# Patient Record
Sex: Female | Born: 1965
Health system: Southern US, Community
[De-identification: ages and names within clinical notes are randomized; demographics above are authoritative.]

## PROBLEM LIST (undated history)

## (undated) DIAGNOSIS — F419 Anxiety disorder, unspecified: Secondary | ICD-10-CM

## (undated) DIAGNOSIS — J45909 Unspecified asthma, uncomplicated: Secondary | ICD-10-CM

## (undated) DIAGNOSIS — F32A Depression, unspecified: Secondary | ICD-10-CM

## (undated) DIAGNOSIS — K219 Gastro-esophageal reflux disease without esophagitis: Secondary | ICD-10-CM

## (undated) DIAGNOSIS — C50919 Malignant neoplasm of unspecified site of unspecified female breast: Secondary | ICD-10-CM

## (undated) HISTORY — PX: BREAST BIOPSY: SHX20

## (undated) HISTORY — PX: BIOPSY BREAST: PRO8

## (undated) HISTORY — PX: LYMPH NODE BIOPSY: SHX201

## (undated) HISTORY — DX: Depression, unspecified: F32.A

## (undated) HISTORY — PX: BREAST LUMPECTOMY: SHX2

## (undated) HISTORY — DX: Malignant neoplasm of unspecified site of unspecified female breast: C50.919

## (undated) HISTORY — DX: Anxiety disorder, unspecified: F41.9

## (undated) HISTORY — DX: Gastro-esophageal reflux disease without esophagitis: K21.9

## (undated) MED FILL — Docetaxel Soln for IV Infusion 160 MG/16ML: INTRAVENOUS | Qty: 16 | Status: AC

## (undated) MED FILL — Cyclophosphamide For Inj 1 GM: INTRAMUSCULAR | Qty: 61 | Status: AC

---

## 2004-09-19 HISTORY — PX: TUBAL LIGATION: SHX77

## 2014-09-19 HISTORY — PX: ABDOMINAL HYSTERECTOMY: SHX81

## 2016-08-04 ENCOUNTER — Ambulatory Visit (INDEPENDENT_AMBULATORY_CARE_PROVIDER_SITE_OTHER): Payer: Self-pay | Admitting: Orthopedic Surgery

## 2016-08-31 ENCOUNTER — Ambulatory Visit (INDEPENDENT_AMBULATORY_CARE_PROVIDER_SITE_OTHER): Payer: Self-pay | Admitting: Specialist

## 2016-09-08 ENCOUNTER — Telehealth (INDEPENDENT_AMBULATORY_CARE_PROVIDER_SITE_OTHER): Payer: Self-pay | Admitting: Specialist

## 2016-09-08 ENCOUNTER — Ambulatory Visit (INDEPENDENT_AMBULATORY_CARE_PROVIDER_SITE_OTHER): Payer: BLUE CROSS/BLUE SHIELD | Admitting: Specialist

## 2016-09-08 ENCOUNTER — Ambulatory Visit (INDEPENDENT_AMBULATORY_CARE_PROVIDER_SITE_OTHER): Payer: Self-pay

## 2016-09-08 ENCOUNTER — Encounter (INDEPENDENT_AMBULATORY_CARE_PROVIDER_SITE_OTHER): Payer: Self-pay | Admitting: Specialist

## 2016-09-08 ENCOUNTER — Encounter (INDEPENDENT_AMBULATORY_CARE_PROVIDER_SITE_OTHER): Payer: Self-pay

## 2016-09-08 VITALS — BP 146/73 | HR 53 | Ht 61.0 in | Wt 187.0 lb

## 2016-09-08 DIAGNOSIS — M5442 Lumbago with sciatica, left side: Secondary | ICD-10-CM | POA: Diagnosis not present

## 2016-09-08 DIAGNOSIS — M5416 Radiculopathy, lumbar region: Secondary | ICD-10-CM | POA: Diagnosis not present

## 2016-09-08 DIAGNOSIS — M5441 Lumbago with sciatica, right side: Secondary | ICD-10-CM | POA: Diagnosis not present

## 2016-09-08 DIAGNOSIS — M7062 Trochanteric bursitis, left hip: Secondary | ICD-10-CM | POA: Diagnosis not present

## 2016-09-08 DIAGNOSIS — G5762 Lesion of plantar nerve, left lower limb: Secondary | ICD-10-CM

## 2016-09-08 MED ORDER — METHYLPREDNISOLONE 4 MG PO TABS: ORAL_TABLET | ORAL | 0 refills | Status: DC

## 2016-09-08 NOTE — Telephone Encounter (Signed)
Pt requested an MRI rev appt on 1/5 at 4p for nitka can you please open something up?  Thank you  Pt number is [phone] can leave vm

## 2016-09-08 NOTE — Progress Notes (Signed)
Office Visit Note   Patient: Kendra Collins           Date of Birth: 04-Jun-1966           MRN: [id number] Visit Date: 09/08/2016              Requested by: No referring provider defined for this encounter. PCP: Pcp Not In System   Assessment & Plan: Visit Diagnoses:  1. Acute midline low back pain with bilateral sciatica   2. Radiculopathy, lumbar region   3. Morton's neuroma, left   4. Greater trochanteric bursitis, left     Plan:  With patient's ongoing and worsening lumbar spine symptoms we will schedule MRI to rule out HNP/stenosis. Follow-up the office after completion to discuss results and further treatment options. I did give her a prescription for Medrol Dosepak 6 day taper see if this will help with all areas addressed today. The future we did discuss conservative management with left hip trochanter bursa injections if needed. We could also consider injections for Morton's neuroma. Patient advised to avoid wearing narrow shoes. May consider custom orthotics. All questions answered.  Follow-Up Instructions: No Follow-up on file.   Orders:  Orders Placed This Encounter  Procedures  . XR Lumbar Spine 2-3 Views  . MR Lumbar Spine w/o contrast   Meds ordered this encounter  Medications  . methylPREDNISolone (MEDROL) 4 MG tablet    Sig: 6 day taper take as directed    Dispense:  21 tablet    Refill:  0      Procedures: No procedures performed   Clinical Data: No additional findings.   Subjective: Chief Complaint  Patient presents with  . Lower Back - Pain    Patient comes in with low back pain radiating into posterior aspect of left hip. States numbness and tingling in bilateral legs. Pain for years, noticed it when she was pregnant. Getting worse the past two years. Constant pain. Standing, standing walking for long periods of time causes increase pain. Loses balance at times.   Describes having pain mostly in the left buttock left lateral hip down to her  left knee. Doesn't having some aching and numbness and tingling in both lower extremities when she is ambulating or standing for long periods. Describes having (the right foot pain between the first and second and second and third metatarsal heads that causes pain and numbness in her toes. Foot symptoms are worse when she wears tight shoes. No complaints of groin pain. She did see an orthopedic surgeon in [place] but she states that she was not pleased with that visit.  Review of Systems  Constitutional: Negative.   HENT: Negative.   Eyes: Negative.   Respiratory: Negative.   Cardiovascular: Negative.   Gastrointestinal: Negative.   Genitourinary: Negative.   Musculoskeletal: Positive for back pain.  Skin: Negative.   Hematological: Negative.   Psychiatric/Behavioral: Negative.      Objective: Vital Signs: BP (!) 146/73   Pulse (!) 53   Ht 5\' 1"  (1.549 m)   Wt 187 lb (84.8 kg)   BMI 35.33 kg/m   Physical Exam  Constitutional: She is oriented to person, place, and time. She appears well-developed. No distress.  HENT:  Head: Normocephalic and atraumatic.  Eyes: EOM are normal. Pupils are equal, round, and reactive to light.  Neck: Normal range of motion.  Pulmonary/Chest: Effort normal. No respiratory distress.  Abdominal: She exhibits no distension.  Musculoskeletal: Normal range of motion.  Gait is  normal. She has good lumbar flexion and extension without discomfort. She has mild left-sided lumbar paraspinal tenderness. Mild left sciatic notch tenderness. Negative on the right side. She is moderate to markedly tender over the left hip greater trochanter bursa and down the course of the IT band. Nontender on the right. Negative logroll. Negative straight leg raise. Bilateral knee exam unremarkable. Bilateral calves are nontender. Neurovascularly intact. No focal motor deficits. Bilateral ankles unremarkable. Bilateral foot exam shows left greater than right tenderness between the  first and second and second and third metatarsal heads.  Neurological: She is alert and oriented to person, place, and time.  Skin: Skin is warm and dry.    Ortho Exam  Specialty Comments:  No specialty comments available.  Imaging: No results found.   PMFS History: There are no active problems to display for this patient.  No past medical history on file.  No family history on file.  No past surgical history on file. Social History   Occupational History  . Not on file.   Social History Main Topics  . Smoking status: Never Smoker  . Smokeless tobacco: Never Used  . Alcohol use No  . Drug use: Unknown  . Sexual activity: Not on file

## 2016-09-08 NOTE — Telephone Encounter (Signed)
sw patient appt 09/29/16 @ 2:45pm mri review

## 2016-09-09 ENCOUNTER — Encounter: Payer: Self-pay | Admitting: Surgery

## 2016-09-29 ENCOUNTER — Encounter (INDEPENDENT_AMBULATORY_CARE_PROVIDER_SITE_OTHER): Payer: Self-pay | Admitting: Specialist

## 2016-09-29 ENCOUNTER — Ambulatory Visit (INDEPENDENT_AMBULATORY_CARE_PROVIDER_SITE_OTHER): Payer: BLUE CROSS/BLUE SHIELD | Admitting: Specialist

## 2016-09-29 VITALS — BP 112/77 | HR 74 | Ht 61.0 in | Wt 187.0 lb

## 2016-09-29 DIAGNOSIS — G8929 Other chronic pain: Secondary | ICD-10-CM

## 2016-09-29 DIAGNOSIS — M5442 Lumbago with sciatica, left side: Secondary | ICD-10-CM | POA: Diagnosis not present

## 2016-09-29 DIAGNOSIS — M48062 Spinal stenosis, lumbar region with neurogenic claudication: Secondary | ICD-10-CM | POA: Diagnosis not present

## 2016-09-29 DIAGNOSIS — M5136 Other intervertebral disc degeneration, lumbar region: Secondary | ICD-10-CM | POA: Diagnosis not present

## 2016-09-29 MED ORDER — TRAMADOL-ACETAMINOPHEN 37.5-325 MG PO TABS: 1.0000 | ORAL_TABLET | Freq: Four times a day (QID) | ORAL | 0 refills | Status: DC | PRN

## 2016-09-29 MED ORDER — NAPROXEN-ESOMEPRAZOLE 375-20 MG PO TBEC: DELAYED_RELEASE_TABLET | ORAL | 3 refills | Status: DC

## 2016-09-29 NOTE — Progress Notes (Signed)
Office Visit Note   Patient: Kendra Collins           Date of Birth: 08-Jul-1966           MRN: [id number] Visit Date: 09/29/2016              Requested by: No referring provider defined for this encounter. PCP: Pcp Not In System   Assessment & Plan: Visit Diagnoses:  1. Chronic bilateral low back pain with left-sided sciatica   2. Degenerative disc disease, lumbar   3. Spinal stenosis of lumbar region with neurogenic claudication     Plan: Avoid frequent bending and stooping  No lifting greater than 10 lbs. May use ice or moist heat for pain. Weight loss is of benefit.   Follow-Up Instructions: Return in about 6 weeks (around 11/10/2016).   Orders:  No orders of the defined types were placed in this encounter.  No orders of the defined types were placed in this encounter.     Procedures: No procedures performed   Clinical Data: Findings:  MRI shows Degenerative disc changes, L3-4, L4-5 and L5-S1 with modic changes at L5-S1. There is assymetri disc bulging with mild left lateral recess stenosis L3-4 and L4-5.    Subjective: Chief Complaint  Patient presents with  . Lower Back - Follow-up    MRI Review    Kendra Collins is here to follow up after her MRI of the lumbar spine. She states that her syptoms are the same. Pain with standing in one place and sitting for any length of time. With standing moving legs helps. Pain is in the back with radiation into the buttocks and into the anterolateral thighs. There is numbness and paresthiasis into the lateral thighs left greater than right.  MRI is reviewed with her today.    Review of Systems  Constitutional: Negative.   HENT: Positive for rhinorrhea, sinus pain and sinus pressure.   Eyes: Negative.   Respiratory: Positive for cough and wheezing.   Cardiovascular: Negative.   Gastrointestinal: Negative.   Endocrine: Negative.   Genitourinary: Negative.   Musculoskeletal: Negative.   Skin: Negative.     Allergic/Immunologic: Negative.   Neurological: Negative.   Hematological: Negative.   Psychiatric/Behavioral: Negative.      Objective: Vital Signs: BP 112/77 (BP Location: Left Arm, Patient Position: Sitting)   Pulse 74   Ht 5\' 1"  (1.549 m)   Wt 187 lb (84.8 kg)   BMI 35.33 kg/m   Physical Exam  Constitutional: She is oriented to person, place, and time. She appears well-developed and well-nourished.  HENT:  Head: Normocephalic and atraumatic.  Eyes: EOM are normal. Pupils are equal, round, and reactive to light.  Neck: Normal range of motion. Neck supple.  Pulmonary/Chest: Effort normal and breath sounds normal.  Abdominal: Soft. Bowel sounds are normal.  Neurological: She is alert and oriented to person, place, and time.  Skin: Skin is warm and dry.  Psychiatric: She has a normal mood and affect. Her behavior is normal. Judgment and thought content normal.    Back Exam   Tenderness  The patient is experiencing tenderness in the lumbar.  Range of Motion  Extension: normal  Flexion: abnormal  Lateral Bend Right: abnormal  Lateral Bend Left: abnormal  Rotation Left: abnormal   Muscle Strength  Right Quadriceps:  5/5  Left Quadriceps:  5/5  Right Hamstrings:  5/5  Left Hamstrings:  5/5   Tests  Straight leg raise right: negative Straight leg raise  left: negative  Reflexes  Patellar: Hyporeflexic Achilles: Hyporeflexic Babinski's sign: normal   Other  Toe Walk: normal Heel Walk: normal Sensation: normal Gait: normal  Erythema: no back redness Scars: absent      Specialty Comments:  No specialty comments available.  Imaging: No results found.   PMFS History: There are no active problems to display for this patient.  No past medical history on file.  No family history on file.  No past surgical history on file. Social History   Occupational History  . Not on file.   Social History Main Topics  . Smoking status: Never Smoker  .  Smokeless tobacco: Never Used  . Alcohol use No  . Drug use: Unknown  . Sexual activity: Not on file

## 2016-09-29 NOTE — Patient Instructions (Signed)
Avoid frequent bending and stooping  No lifting greater than 10 lbs. May use ice or moist heat for pain. Weight loss is of benefit.   

## 2016-09-30 ENCOUNTER — Telehealth (INDEPENDENT_AMBULATORY_CARE_PROVIDER_SITE_OTHER): Payer: Self-pay | Admitting: Specialist

## 2016-09-30 NOTE — Telephone Encounter (Signed)
Can you advise? 

## 2016-09-30 NOTE — Telephone Encounter (Signed)
Can you ask Dr. Yates about this? 

## 2016-09-30 NOTE — Telephone Encounter (Signed)
[facility]

## 2016-09-30 NOTE — Telephone Encounter (Signed)
Ok with me ,ucall

## 2016-11-10 ENCOUNTER — Ambulatory Visit (INDEPENDENT_AMBULATORY_CARE_PROVIDER_SITE_OTHER): Payer: BLUE CROSS/BLUE SHIELD | Admitting: Specialist

## 2016-12-15 ENCOUNTER — Ambulatory Visit (INDEPENDENT_AMBULATORY_CARE_PROVIDER_SITE_OTHER): Payer: BLUE CROSS/BLUE SHIELD | Admitting: Surgery

## 2016-12-15 ENCOUNTER — Encounter (INDEPENDENT_AMBULATORY_CARE_PROVIDER_SITE_OTHER): Payer: Self-pay | Admitting: Physician Assistant

## 2016-12-15 DIAGNOSIS — M5442 Lumbago with sciatica, left side: Secondary | ICD-10-CM

## 2016-12-15 DIAGNOSIS — M5136 Other intervertebral disc degeneration, lumbar region: Secondary | ICD-10-CM | POA: Diagnosis not present

## 2016-12-15 DIAGNOSIS — M48062 Spinal stenosis, lumbar region with neurogenic claudication: Secondary | ICD-10-CM

## 2016-12-15 DIAGNOSIS — G8929 Other chronic pain: Secondary | ICD-10-CM | POA: Diagnosis not present

## 2016-12-15 MED ORDER — TRAMADOL-ACETAMINOPHEN 37.5-325 MG PO TABS: 1.0000 | ORAL_TABLET | Freq: Two times a day (BID) | ORAL | 0 refills | Status: DC | PRN

## 2016-12-15 NOTE — Progress Notes (Signed)
Office Visit Note   Patient: Kendra Collins           Date of Birth: Jan 15, 1966           MRN: [id number] Visit Date: 12/15/2016              Requested by: No referring provider defined for this encounter. PCP: Pcp Not In System   Assessment & Plan: Visit Diagnoses:  1. Chronic bilateral low back pain with left-sided sciatica   2. Degenerative disc disease, lumbar   3. Spinal stenosis of lumbar region with neurogenic claudication     Plan: Advised patient that we do not really fill out this particular FMLA form that she brought in. If she has a flare up of her symptoms recommend that she contact us immediately and schedule a follow-up appointment. She voices understanding. Refilled Ultracet. Given samples of Pennsaid to apply over the left SI joint area. If this helps she will call and let us know and we'll send in a prescription.  Follow-Up Instructions: Return in 6 months (on 06/17/2017).   Orders:  No orders of the defined types were placed in this encounter.  Meds ordered this encounter  Medications  . traMADol-acetaminophen (ULTRACET) 37.5-325 MG tablet    Sig: Take 1 tablet by mouth every 12 (twelve) hours as needed for severe pain.    Dispense:  90 tablet    Refill:  0      Procedures: No procedures performed   Clinical Data: No additional findings.   Subjective: Chief Complaint  Patient presents with  . Left Hip - Follow-up    HPI Patient returns for recheck. Currently doing much better. She still has some soreness around the left lumbar area and also over the left lateral hip.  She is currently working. No completes of true lower extremity radicular symptoms. I had ordered lumbar spine MRI report from 09/09/2016 read L3-4 mild disc space narrowing. Minimal disc bulging and small left foraminal disc protrusion with punctate annular fissure. No significant stenosis through the disc protrusion is in close proximity to the left L4 nerve roots in the lateral  recess. L4-5 mild disc bulging and mild facet hypertrophy resulting in mild left lateral recess stenosis and potential left L5 nerve root irritation although no frank compression. Patient brought in a FMLA form and asked about this getting filled out in case she has a flare up of symptoms and needs to be out of work. Patient also asked for a refill of Ultracet. States that she is unable to take oral NSAIDs due to history of GERD. Review of Systems  Constitutional: Negative.   HENT: Negative.   Respiratory: Negative.   Musculoskeletal: Positive for back pain. Negative for gait problem.  Psychiatric/Behavioral: Negative.      Objective: Vital Signs: There were no vitals taken for this visit.  Physical Exam  Constitutional: She is oriented to person, place, and time. She appears well-developed and well-nourished.  HENT:  Head: Normocephalic and atraumatic.  Eyes: EOM are normal. Pupils are equal, round, and reactive to light.  Neck: Normal range of motion.  Pulmonary/Chest: No respiratory distress.  Abdominal: She exhibits no distension.  Musculoskeletal:  Mild left lower lumbar paraspinal tenderness. Mild tenderness over the left SI joint. Negative straight leg raise. Negative logroll. Mild tenderness over the left hip greater bursa.  Neurological: She is alert and oriented to person, place, and time.  Skin: Skin is warm and dry.  Psychiatric: She has a normal mood  and affect.    Ortho Exam  Specialty Comments:  No specialty comments available.  Imaging: No results found.   PMFS History: There are no active problems to display for this patient.  No past medical history on file.  No family history on file.  No past surgical history on file. Social History   Occupational History  . Not on file.   Social History Main Topics  . Smoking status: Never Smoker  . Smokeless tobacco: Never Used  . Alcohol use No  . Drug use: Unknown  . Sexual activity: Not on file

## 2020-01-09 ENCOUNTER — Inpatient Hospital Stay (HOSPITAL_COMMUNITY)
Admission: AD | Admit: 2020-01-09 | Discharge: 2020-01-15 | DRG: 177 | Disposition: A | Discharge status: Home Health | Payer: No Typology Code available for payment source | Source: Other Acute Inpatient Hospital | Attending: Internal Medicine | Admitting: Internal Medicine

## 2020-01-09 ENCOUNTER — Other Ambulatory Visit: Payer: Self-pay

## 2020-01-09 ENCOUNTER — Encounter (HOSPITAL_COMMUNITY): Payer: Self-pay | Admitting: Family Medicine

## 2020-01-09 ENCOUNTER — Telehealth: Payer: Self-pay | Admitting: Internal Medicine

## 2020-01-09 DIAGNOSIS — J9621 Acute and chronic respiratory failure with hypoxia: Secondary | ICD-10-CM

## 2020-01-09 DIAGNOSIS — J159 Unspecified bacterial pneumonia: Secondary | ICD-10-CM | POA: Diagnosis present

## 2020-01-09 DIAGNOSIS — J4520 Mild intermittent asthma, uncomplicated: Secondary | ICD-10-CM | POA: Diagnosis present

## 2020-01-09 DIAGNOSIS — Z87891 Personal history of nicotine dependence: Secondary | ICD-10-CM | POA: Diagnosis not present

## 2020-01-09 DIAGNOSIS — Z79899 Other long term (current) drug therapy: Secondary | ICD-10-CM | POA: Diagnosis not present

## 2020-01-09 DIAGNOSIS — Z6836 Body mass index (BMI) 36.0-36.9, adult: Secondary | ICD-10-CM | POA: Diagnosis not present

## 2020-01-09 DIAGNOSIS — F419 Anxiety disorder, unspecified: Secondary | ICD-10-CM | POA: Diagnosis present

## 2020-01-09 DIAGNOSIS — J1282 Pneumonia due to coronavirus disease 2019: Secondary | ICD-10-CM | POA: Diagnosis present

## 2020-01-09 DIAGNOSIS — R609 Edema, unspecified: Secondary | ICD-10-CM | POA: Diagnosis not present

## 2020-01-09 DIAGNOSIS — R0602 Shortness of breath: Secondary | ICD-10-CM

## 2020-01-09 DIAGNOSIS — J069 Acute upper respiratory infection, unspecified: Secondary | ICD-10-CM

## 2020-01-09 DIAGNOSIS — U071 COVID-19: Principal | ICD-10-CM

## 2020-01-09 DIAGNOSIS — R7401 Elevation of levels of liver transaminase levels: Secondary | ICD-10-CM | POA: Diagnosis not present

## 2020-01-09 HISTORY — DX: Unspecified asthma, uncomplicated: J45.909

## 2020-01-09 MED ORDER — ALBUTEROL SULFATE HFA 108 (90 BASE) MCG/ACT IN AERS
2.0000 | INHALATION_SPRAY | RESPIRATORY_TRACT | Status: DC | PRN
  Administered 2020-01-09: 2 via RESPIRATORY_TRACT
  Filled 2020-01-09: qty 6.7

## 2020-01-09 MED ORDER — SODIUM CHLORIDE 0.9 % IV SOLN
250.0000 mL | INTRAVENOUS | Status: DC | PRN
  Administered 2020-01-09: 250 mL via INTRAVENOUS

## 2020-01-09 MED ORDER — SODIUM CHLORIDE 0.9 % IV SOLN
2.0000 g | INTRAVENOUS | Status: AC
  Administered 2020-01-09 – 2020-01-13 (×5): 2 g via INTRAVENOUS
  Filled 2020-01-09 (×5): qty 20

## 2020-01-09 MED ORDER — ONDANSETRON HCL 4 MG/2ML IJ SOLN: 4.0000 mg | Freq: Four times a day (QID) | INTRAMUSCULAR | Status: DC | PRN

## 2020-01-09 MED ORDER — SODIUM CHLORIDE 0.9 % IV SOLN
500.0000 mg | INTRAVENOUS | Status: DC
  Administered 2020-01-09 – 2020-01-12 (×4): 500 mg via INTRAVENOUS
  Filled 2020-01-09 (×5): qty 500

## 2020-01-09 MED ORDER — HYDROCODONE-ACETAMINOPHEN 5-325 MG PO TABS: 1.0000 | ORAL_TABLET | ORAL | Status: DC | PRN

## 2020-01-09 MED ORDER — SENNOSIDES-DOCUSATE SODIUM 8.6-50 MG PO TABS: 1.0000 | ORAL_TABLET | Freq: Every evening | ORAL | Status: DC | PRN

## 2020-01-09 MED ORDER — SODIUM CHLORIDE 0.9% FLUSH: 3.0000 mL | INTRAVENOUS | Status: DC | PRN

## 2020-01-09 MED ORDER — SODIUM CHLORIDE 0.9% FLUSH
3.0000 mL | Freq: Two times a day (BID) | INTRAVENOUS | Status: DC
  Administered 2020-01-09 – 2020-01-15 (×12): 3 mL via INTRAVENOUS

## 2020-01-09 MED ORDER — ALUM & MAG HYDROXIDE-SIMETH 200-200-20 MG/5ML PO SUSP
30.0000 mL | ORAL | Status: DC | PRN
  Administered 2020-01-09 – 2020-01-13 (×8): 30 mL via ORAL
  Filled 2020-01-09 (×9): qty 30

## 2020-01-09 MED ORDER — SODIUM CHLORIDE 0.9% FLUSH
3.0000 mL | Freq: Two times a day (BID) | INTRAVENOUS | Status: DC
  Administered 2020-01-10 (×2): 3 mL via INTRAVENOUS

## 2020-01-09 MED ORDER — ACETAMINOPHEN 325 MG PO TABS
650.0000 mg | ORAL_TABLET | Freq: Four times a day (QID) | ORAL | Status: DC | PRN
  Administered 2020-01-10: 650 mg via ORAL
  Filled 2020-01-09: qty 2

## 2020-01-09 MED ORDER — ENOXAPARIN SODIUM 40 MG/0.4ML ~~LOC~~ SOLN
40.0000 mg | SUBCUTANEOUS | Status: DC
  Administered 2020-01-09 – 2020-01-11 (×3): 40 mg via SUBCUTANEOUS
  Filled 2020-01-09 (×3): qty 0.4

## 2020-01-09 MED ORDER — DEXAMETHASONE SODIUM PHOSPHATE 10 MG/ML IJ SOLN: 6.0000 mg | INTRAMUSCULAR | Status: DC

## 2020-01-09 MED ORDER — SODIUM CHLORIDE 0.9 % IV SOLN
100.0000 mg | Freq: Every day | INTRAVENOUS | Status: AC
  Administered 2020-01-10 – 2020-01-13 (×4): 100 mg via INTRAVENOUS
  Filled 2020-01-09 (×4): qty 20

## 2020-01-09 MED ORDER — GUAIFENESIN-DM 100-10 MG/5ML PO SYRP
10.0000 mL | ORAL_SOLUTION | ORAL | Status: DC | PRN
  Administered 2020-01-13 – 2020-01-14 (×2): 10 mL via ORAL
  Filled 2020-01-09 (×2): qty 10

## 2020-01-09 MED ORDER — ONDANSETRON HCL 4 MG PO TABS: 4.0000 mg | ORAL_TABLET | Freq: Four times a day (QID) | ORAL | Status: DC | PRN

## 2020-01-09 NOTE — Progress Notes (Signed)
[facility] TELEPHONE ENCOUNTER NOTE  Patient: Kendra Collins YBT:[id number]   PCP: System, Pcp Not In DOB: November 04, 1965   DOS: 01/09/2020     Received a phone call from [facility] about patient transfer. Patient tested Covid positive in early April. Progressively getting worse with fever and started having shortness of breath. On arrival patient was 86% on room air requiring 2 L of oxygen. Now progressively worsening to 6 L of oxygen saturating 92%.  D-dimer is 840, ferritin is in 2000, LFTs are in the 90s and CRP is 270. Patient is receiving IV Decadron. Patient will be accepted to progressive care unit at [facility].  Author:  Lynden Oxford, MD [facility] 01/09/2020  If 7PM-7AM, please contact night-coverage To reach On-call, see [email or web address]

## 2020-01-09 NOTE — H&P (Signed)
History and Physical    EVAGELIA KNACK NLG:[id number] DOB: 1965-09-21 DOA: 01/09/2020  PCP: System, Pcp Not In   Patient coming from: Home   Chief Complaint: SOB   HPI: Kendra Collins is a 54 y.o. female with medical history significant for mild intermittent asthma, now presenting to emergency department with progressive shortness of breath.  Patient reports that she developed general malaise, fevers, chills, nausea, vomiting, diarrhea, and cough almost 2 weeks ago, was seen in the urgent care at that time and found to be positive for COVID-19.  She continues to have general malaise, fatigue, and improving GI symptoms, but is mainly concerned with progressive dyspnea, now short of breath while at rest.  She denies any chest pain or abdominal pain, denies lower extremity swelling or tenderness, and denies melena or hematochezia.  [facility] ED Course: Upon arrival to the ED, patient is found to be afebrile, saturating mid 80s on room air, and with remaining vitals normal.  EKG with sinus rhythm with sinus arrhythmia.  Chest x-ray features bilateral hazy airspace opacities.  Chemistry panel with mild elevation in transaminases.  CBC unremarkable.  Troponin undetectable.  Procalcitonin elevated to 51.7, D-dimer elevated to 0.84, and CRP is >220.  Patient was started on supplemental oxygen and treated with Decadron, remdesivir, vancomycin, and cefepime in the ED.  Review of Systems:  All other systems reviewed and apart from HPI, are negative.  Past Medical History:  Diagnosis Date  . Asthma     History reviewed. No pertinent surgical history.   reports that she has quit smoking. She has never used smokeless tobacco. She reports that she does not drink alcohol or use drugs.  No Known Allergies  Family History  Problem Relation Age of Onset  . CVA Father   . Heart disease Father      Prior to Admission medications   Medication Sig Start Date End Date Taking? Authorizing Provider  BELVIQ  XR 20 MG TB24 Take 1 tablet by mouth daily. 08/01/16   [provider]  estradiol (ESTRACE) 0.5 MG tablet Take 0.5 mg by mouth daily. 08/15/16   [provider]  fluticasone Asencion Islam) 50 MCG/ACT nasal spray TWO SPRAYS IN [facility] NOSTRIL DAILY 08/31/16   [provider]  methylPREDNISolone (MEDROL) 4 MG tablet 6 day taper take as directed 09/08/16   Lanae Crumbly, PA-C  montelukast (SINGULAIR) 10 MG tablet Take 10 mg by mouth every evening. 08/24/16   [provider]  Naproxen-Esomeprazole (VIMOVO) 375-20 MG TBEC Take one tablet po BID 09/29/16   Jessy Oto, MD  oseltamivir (TAMIFLU) 75 MG capsule Take 75 mg by mouth 2 (two) times daily. 08/24/16   [provider]  traMADol-acetaminophen (ULTRACET) 37.5-325 MG tablet Take 1 tablet by mouth every 12 (twelve) hours as needed for severe pain. 12/15/16   Lanae Crumbly, PA-C    Physical Exam: Vitals:   01/09/20 2011  BP: 115/70  Pulse: 71  Resp: 18  Temp: 98.1 F (36.7 C)  TempSrc: Oral  SpO2: 93%     Constitutional: NAD, calm  Eyes: PERTLA, lids and conjunctivae normal ENMT: Mucous membranes are moist. Posterior pharynx clear of any exudate or lesions.   Neck: normal, supple, no masses, no thyromegaly Respiratory: tachypnea, no wheezing. No pallor or cyanosis use.   Cardiovascular: S1 & S2 heard, regular rate and rhythm. No extremity edema.   Abdomen: No distension, no tenderness, soft. Bowel sounds active.  Musculoskeletal: no clubbing / cyanosis. No joint  deformity upper and lower extremities.   Skin: no significant rashes, lesions, ulcers. Warm, dry, well-perfused. Neurologic: No facial asymmetry. Sensation intact. Moving all extremities.  Psychiatric: Alert and oriented to person, place, and situation. Very pleasant and cooperative.    Labs and Imaging on Admission: I have personally reviewed following labs and imaging studies  CBC: No results for input(s): WBC, NEUTROABS, HGB, HCT,  MCV, PLT in the last 168 hours. Basic Metabolic Panel: No results for input(s): NA, K, CL, CO2, GLUCOSE, BUN, CREATININE, CALCIUM, MG, PHOS in the last 168 hours. GFR: CrCl cannot be calculated (No successful lab value found.). Liver Function Tests: No results for input(s): AST, ALT, ALKPHOS, BILITOT, PROT, ALBUMIN in the last 168 hours. No results for input(s): LIPASE, AMYLASE in the last 168 hours. No results for input(s): AMMONIA in the last 168 hours. Coagulation Profile: No results for input(s): INR, PROTIME in the last 168 hours. Cardiac Enzymes: No results for input(s): CKTOTAL, CKMB, CKMBINDEX, TROPONINI in the last 168 hours. BNP (last 3 results) No results for input(s): PROBNP in the last 8760 hours. HbA1C: No results for input(s): HGBA1C in the last 72 hours. CBG: No results for input(s): GLUCAP in the last 168 hours. Lipid Profile: No results for input(s): CHOL, HDL, LDLCALC, TRIG, CHOLHDL, LDLDIRECT in the last 72 hours. Thyroid Function Tests: No results for input(s): TSH, T4TOTAL, FREET4, T3FREE, THYROIDAB in the last 72 hours. Anemia Panel: No results for input(s): VITAMINB12, FOLATE, FERRITIN, TIBC, IRON, RETICCTPCT in the last 72 hours. Urine analysis: No results found for: COLORURINE, APPEARANCEUR, LABSPEC, PHURINE, GLUCOSEU, HGBUR, BILIRUBINUR, KETONESUR, PROTEINUR, UROBILINOGEN, NITRITE, LEUKOCYTESUR Sepsis Labs: @LABRCNTIP (procalcitonin:4,lacticidven:4) )No results found for this or any previous visit (from the past 240 hour(s)).   Radiological Exams on Admission: No results found.  EKG: Independently reviewed. Sinus rhythm with sinus arrhythmia, rate 70, QTc 401 ms.   Assessment/Plan   1. Multifocal PNA; COVID-19; acute hypoxic respiratory failure  - Presents with progressive SOB after developing malaise, f/c, cough, and N/V/D almost 2 wks ago, found to have COVID-19, multifocal infiltrates on CXR, new 5 Lpm supplemental O2 requirement, and procalcitonin  of 51.7  - She was treated in [place] ED with supplemental O2, Decadron, remdesivir, vancomycin, and cefepime  - Continue Decadron and remdesivir, continue supplemental O2 as needed, continue antibiotics with Rocephin and azithromycin, trend markers    2. Asthma  - Continue as-needed albuterol     DVT prophylaxis: Lovenox  Code Status: Full  Family Communication: Discussed with patient  Disposition Plan:  Patient is from: Home  Anticipated d/c is to: Home  Anticipated d/c date is: 01/14/20 Patient currently: Dyspneic and hypoxic at rest, requiring ongoing inpatient management  Consults called: none  Admission status: Inpatient     01/16/20, MD [facility] Pager: See [email or web address]  If 7AM-7PM, please contact the daytime attending [email or web address]  01/09/2020, 8:16 PM

## 2020-01-09 NOTE — Telephone Encounter (Signed)
Received a call for patient transfer from [facility] ER for COVID-19 pneumonia with acute hypoxic respiratory failure.  Patient accepted for progressive care unit.

## 2020-01-10 DIAGNOSIS — R7401 Elevation of levels of liver transaminase levels: Secondary | ICD-10-CM

## 2020-01-10 LAB — CBC WITH DIFFERENTIAL/PLATELET
Abs Immature Granulocytes: 0.29 10*3/uL — ABNORMAL HIGH (ref 0.00–0.07)
Basophils Absolute: 0.1 10*3/uL (ref 0.0–0.1)
Basophils Relative: 1 %
Eosinophils Absolute: 0 10*3/uL (ref 0.0–0.5)
Eosinophils Relative: 0 %
HCT: 44 % (ref 36.0–46.0)
Hemoglobin: 14.5 g/dL (ref 12.0–15.0)
Immature Granulocytes: 5 %
Lymphocytes Relative: 21 %
Lymphs Abs: 1.3 10*3/uL (ref 0.7–4.0)
MCH: 27.6 pg (ref 26.0–34.0)
MCHC: 33 g/dL (ref 30.0–36.0)
MCV: 83.7 fL (ref 80.0–100.0)
Monocytes Absolute: 0.4 10*3/uL (ref 0.1–1.0)
Monocytes Relative: 6 %
Neutro Abs: 4.1 10*3/uL (ref 1.7–7.7)
Neutrophils Relative %: 67 %
Platelets: 453 10*3/uL — ABNORMAL HIGH (ref 150–400)
RBC: 5.26 MIL/uL — ABNORMAL HIGH (ref 3.87–5.11)
RDW: 13.4 % (ref 11.5–15.5)
WBC: 6.2 10*3/uL (ref 4.0–10.5)
nRBC: 0 % (ref 0.0–0.2)

## 2020-01-10 LAB — COMPREHENSIVE METABOLIC PANEL
ALT: 50 U/L — ABNORMAL HIGH (ref 0–44)
AST: 40 U/L (ref 15–41)
Albumin: 2.3 g/dL — ABNORMAL LOW (ref 3.5–5.0)
Alkaline Phosphatase: 94 U/L (ref 38–126)
Anion gap: 12 (ref 5–15)
BUN: 12 mg/dL (ref 6–20)
CO2: 22 mmol/L (ref 22–32)
Calcium: 8.4 mg/dL — ABNORMAL LOW (ref 8.9–10.3)
Chloride: 105 mmol/L (ref 98–111)
Creatinine, Ser: 0.61 mg/dL (ref 0.44–1.00)
GFR calc Af Amer: >60 mL/min (ref >60)
GFR calc non Af Amer: >60 mL/min (ref >60)
Glucose, Bld: 107 mg/dL — ABNORMAL HIGH (ref 70–99)
Potassium: 3.8 mmol/L (ref 3.5–5.1)
Sodium: 139 mmol/L (ref 135–145)
Total Bilirubin: 0.6 mg/dL (ref 0.3–1.2)
Total Protein: 6.3 g/dL — ABNORMAL LOW (ref 6.5–8.1)

## 2020-01-10 LAB — C-REACTIVE PROTEIN: CRP: 13.1 mg/dL — ABNORMAL HIGH (ref <1.0)

## 2020-01-10 LAB — ABO/RH: ABO/RH(D): O POS

## 2020-01-10 LAB — MAGNESIUM: Magnesium: 2.2 mg/dL (ref 1.7–2.4)

## 2020-01-10 LAB — PROCALCITONIN: Procalcitonin: 36.89 ng/mL

## 2020-01-10 LAB — FERRITIN: Ferritin: 1351 ng/mL — ABNORMAL HIGH (ref 11–307)

## 2020-01-10 LAB — MRSA PCR SCREENING: MRSA by PCR: NEGATIVE

## 2020-01-10 LAB — D-DIMER, QUANTITATIVE: D-Dimer, Quant: 0.54 ug/mL-FEU — ABNORMAL HIGH (ref 0.00–0.50)

## 2020-01-10 LAB — HIV ANTIBODY (ROUTINE TESTING W REFLEX): HIV Screen 4th Generation wRfx: NONREACTIVE

## 2020-01-10 MED ORDER — ORAL CARE MOUTH RINSE
15.0000 mL | Freq: Two times a day (BID) | OROMUCOSAL | Status: DC
  Administered 2020-01-10 – 2020-01-15 (×9): 15 mL via OROMUCOSAL

## 2020-01-10 MED ORDER — METHYLPREDNISOLONE SODIUM SUCC 40 MG IJ SOLR
40.0000 mg | Freq: Two times a day (BID) | INTRAMUSCULAR | Status: DC
  Administered 2020-01-10 – 2020-01-12 (×6): 40 mg via INTRAVENOUS
  Filled 2020-01-10 (×6): qty 1

## 2020-01-10 NOTE — Progress Notes (Signed)
PROGRESS NOTE                                                                                                                                                                                                             Patient Demographics:    Kendra Collins, is a 54 y.o. female, DOB - [birth year], QIO:[id number]  Outpatient Primary MD for the patient is System, Pcp Not In   Admit date - 01/09/2020   LOS - 1  No chief complaint on file.      Brief Narrative: Patient is a 54 y.o. female with PMHx of bronchial asthma-who was diagnosed with COVID-19 on 4/9 (per [facility] ED documentation)-transfer to [facility] from [place] for evaluation of acute hypoxemic respiratory failure secondary to COVID-19 pneumonia  Significant Events: 4/9>> Covid positive at local urgent care in [place] (per documentation from [facility]) 4/22>> transferred from [place] for hypoxia secondary to COVID-19/bacterial pneumonia  COVID-19 medications: Steroids: 4/22>> Remdesivir: 4/22>>  Antibiotics: Rocephin: 4/22>> Zithromax: 4/22>>  Microbiology data: Blood culture: 4/22>> ordered  DVT prophylaxis: SQ Lovenox  Procedures: None  Consults: None    Subjective:    Latanja Lehenbauer today-she is still on 6 L of oxygen.  She was proning when I saw her earlier this morning.   Assessment  & Plan :   Acute Hypoxic Resp Failure due to Covid 19 Viral pneumonia and concurrent bacterial pneumonia: Feels better but still hypoxic-requiring around 6 L of oxygen.  Continue steroids/remdesivir and empiric Rocephin/Zithromax.  Follow clinical trajectory and inflammatory markers.  Given elevated procalcitonin/concern for bacterial pneumonia-not a candidate for Actemra.  Fever: afebrile  O2 requirements:  SpO2: 97 % O2 Flow Rate (L/min): 6 L/min   COVID-19 Labs: Recent Labs    01/10/20 0539  DDIMER 0.54*  FERRITIN 1,351*  CRP  13.1*    No results found for: BNP  Recent Labs  Lab 01/10/20 0539  PROCALCITON 36.89    No results found for: SARSCOV2NAA    Prone/Incentive Spirometry: encouraged patient to lie prone for 3-4 hours at a time for a total of 16 hours a day, and to encourage incentive spirometry use 3-4/hour.  Transaminitis: Appears to be mild-stable for follow-up.  Bronchial asthma: No exacerbation-continue bronchodilators.  Morbid Obesity: Estimated body mass index is 36.7 kg/m as calculated from the following:  Height as of this encounter: 5\' 1"  (1.549 m).   Weight as of this encounter: 88.1 kg.   ABG: No results found for: PHART, PCO2ART, PO2ART, HCO3, TCO2, ACIDBASEDEF, O2SAT  Vent Settings: N/A  Condition -  Guarded  Family Communication  :  Spouse updated over the phone 4/23  Code Status :  Full Code  Diet :  Diet Order            Diet Heart Room service appropriate? Yes; Fluid consistency: Thin  Diet effective now               Disposition Plan  :  Status is: Inpatient  Remains inpatient appropriate because:Inpatient level of care appropriate due to severity of illness  Dispo: The patient is from: Home              Anticipated d/c is to: Home              Anticipated d/c date is: > 3 days              Patient currently is not medically stable to d/c.   Barriers to discharge: Hypoxia requiring O2 supplementation/complete 5 days of IV Remdesivir/IV antibiotics  Antimicorbials  :    Anti-infectives (From admission, onward)   Start     Dose/Rate Route Frequency Ordered Stop   01/10/20 1000  remdesivir 100 mg in sodium chloride 0.9 % 100 mL IVPB     100 mg 200 mL/hr over 30 Minutes Intravenous Daily 01/09/20 2138 01/14/20 0959   01/09/20 2115  azithromycin (ZITHROMAX) 500 mg in sodium chloride 0.9 % 250 mL IVPB     500 mg 250 mL/hr over 60 Minutes Intravenous Every 24 hours 01/09/20 2009 01/14/20 2114   01/09/20 2100  cefTRIAXone (ROCEPHIN) 2 g in sodium  chloride 0.9 % 100 mL IVPB     2 g 200 mL/hr over 30 Minutes Intravenous Every 24 hours 01/09/20 2009 01/14/20 2059      Inpatient Medications  Scheduled Meds: . enoxaparin (LOVENOX) injection  40 mg Subcutaneous Q24H  . mouth rinse  15 mL Mouth Rinse BID  . methylPREDNISolone (SOLU-MEDROL) injection  40 mg Intravenous Q12H  . sodium chloride flush  3 mL Intravenous Q12H  . sodium chloride flush  3 mL Intravenous Q12H   Continuous Infusions: . sodium chloride 250 mL (01/09/20 2033)  . azithromycin 500 mg (01/09/20 2116)  . cefTRIAXone (ROCEPHIN)  IV 2 g (01/09/20 2036)  . remdesivir 100 mg in NS 100 mL 100 mg (01/10/20 0819)   PRN Meds:.sodium chloride, acetaminophen, albuterol, alum & mag hydroxide-simeth, guaiFENesin-dextromethorphan, HYDROcodone-acetaminophen, ondansetron **OR** ondansetron (ZOFRAN) IV, senna-docusate, sodium chloride flush   Time Spent in minutes  35  See all Orders from today for further details   01/12/20 M.D on 01/10/2020 at 1:25 PM  To page go to [email or web address] - use universal password  [facility] -  Office  [phone]    Objective:   Vitals:   01/10/20 0000 01/10/20 0440 01/10/20 0744 01/10/20 1218  BP: 129/80 (!) 138/91 125/85 (!) 142/86  Pulse: 68 90 98 91  Resp: 12 20 (!) 26 (!) 22  Temp:  98.2 F (36.8 C) 98.1 F (36.7 C) 98 F (36.7 C)  TempSrc:  Oral Oral Oral  SpO2: 91% 92% 95% 97%  Weight:      Height:        Wt Readings from Last 3 Encounters:  01/09/20 88.1 kg  09/29/16 84.8 kg  09/08/16  84.8 kg     Intake/Output Summary (Last 24 hours) at 01/10/2020 1325 Last data filed at 01/10/2020 0830 Gross per 24 hour  Intake 960.01 ml  Output --  Net 960.01 ml     Physical Exam Gen Exam:Alert awake-not in any distress HEENT:atraumatic, normocephalic Chest: B/L clear to auscultation anteriorly CVS:S1S2 regular Abdomen:soft non tender, non distended Extremities:no edema Neurology: Non focal Skin: no  rash   Data Review:    CBC Recent Labs  Lab 01/10/20 0539  WBC 6.2  HGB 14.5  HCT 44.0  PLT 453*  MCV 83.7  MCH 27.6  MCHC 33.0  RDW 13.4  LYMPHSABS 1.3  MONOABS 0.4  EOSABS 0.0  BASOSABS 0.1    Chemistries  Recent Labs  Lab 01/10/20 0539  NA 139  K 3.8  CL 105  CO2 22  GLUCOSE 107*  BUN 12  CREATININE 0.61  CALCIUM 8.4*  MG 2.2  AST 40  ALT 50*  ALKPHOS 94  BILITOT 0.6   ------------------------------------------------------------------------------------------------------------------ No results for input(s): CHOL, HDL, LDLCALC, TRIG, CHOLHDL, LDLDIRECT in the last 72 hours.  No results found for: HGBA1C ------------------------------------------------------------------------------------------------------------------ No results for input(s): TSH, T4TOTAL, T3FREE, THYROIDAB in the last 72 hours.  Invalid input(s): FREET3 ------------------------------------------------------------------------------------------------------------------ Recent Labs    01/10/20 0539  FERRITIN 1,351*    Coagulation profile No results for input(s): INR, PROTIME in the last 168 hours.  Recent Labs    01/10/20 0539  DDIMER 0.54*    Cardiac Enzymes No results for input(s): CKMB, TROPONINI, MYOGLOBIN in the last 168 hours.  Invalid input(s): CK ------------------------------------------------------------------------------------------------------------------ No results found for: BNP  Micro Results Recent Results (from the past 240 hour(s))  MRSA PCR Screening     Status: None   Collection Time: 01/10/20  5:56 AM   Specimen: Nasal Mucosa; Nasopharyngeal  Result Value Ref Range Status   MRSA by PCR NEGATIVE NEGATIVE Final    Comment:        The GeneXpert MRSA Assay (FDA approved for NASAL specimens only), is one component of a comprehensive MRSA colonization surveillance program. It is not intended to diagnose MRSA infection nor to guide or monitor treatment  for MRSA infections. Performed at [facility], [address], [place], [place] [address]     Radiology Reports No results found.

## 2020-01-11 ENCOUNTER — Inpatient Hospital Stay (HOSPITAL_COMMUNITY): Payer: No Typology Code available for payment source

## 2020-01-11 LAB — CBC WITH DIFFERENTIAL/PLATELET
Abs Immature Granulocytes: 0.13 10*3/uL — ABNORMAL HIGH (ref 0.00–0.07)
Basophils Absolute: 0 10*3/uL (ref 0.0–0.1)
Basophils Relative: 0 %
Eosinophils Absolute: 0 10*3/uL (ref 0.0–0.5)
Eosinophils Relative: 0 %
HCT: 41.8 % (ref 36.0–46.0)
Hemoglobin: 13.6 g/dL (ref 12.0–15.0)
Immature Granulocytes: 2 %
Lymphocytes Relative: 17 %
Lymphs Abs: 1.2 10*3/uL (ref 0.7–4.0)
MCH: 27 pg (ref 26.0–34.0)
MCHC: 32.5 g/dL (ref 30.0–36.0)
MCV: 82.9 fL (ref 80.0–100.0)
Monocytes Absolute: 0.3 10*3/uL (ref 0.1–1.0)
Monocytes Relative: 4 %
Neutro Abs: 5.4 10*3/uL (ref 1.7–7.7)
Neutrophils Relative %: 77 %
Platelets: 465 10*3/uL — ABNORMAL HIGH (ref 150–400)
RBC: 5.04 MIL/uL (ref 3.87–5.11)
RDW: 13.4 % (ref 11.5–15.5)
WBC: 7.1 10*3/uL (ref 4.0–10.5)
nRBC: 0 % (ref 0.0–0.2)

## 2020-01-11 LAB — COMPREHENSIVE METABOLIC PANEL
ALT: 37 U/L (ref 0–44)
AST: 24 U/L (ref 15–41)
Albumin: 2.2 g/dL — ABNORMAL LOW (ref 3.5–5.0)
Alkaline Phosphatase: 84 U/L (ref 38–126)
Anion gap: 12 (ref 5–15)
BUN: 13 mg/dL (ref 6–20)
CO2: 22 mmol/L (ref 22–32)
Calcium: 8.3 mg/dL — ABNORMAL LOW (ref 8.9–10.3)
Chloride: 107 mmol/L (ref 98–111)
Creatinine, Ser: 0.49 mg/dL (ref 0.44–1.00)
GFR calc Af Amer: >60 mL/min (ref >60)
GFR calc non Af Amer: >60 mL/min (ref >60)
Glucose, Bld: 154 mg/dL — ABNORMAL HIGH (ref 70–99)
Potassium: 3.9 mmol/L (ref 3.5–5.1)
Sodium: 141 mmol/L (ref 135–145)
Total Bilirubin: 0.3 mg/dL (ref 0.3–1.2)
Total Protein: 6 g/dL — ABNORMAL LOW (ref 6.5–8.1)

## 2020-01-11 LAB — C-REACTIVE PROTEIN: CRP: 4.2 mg/dL — ABNORMAL HIGH (ref <1.0)

## 2020-01-11 LAB — BRAIN NATRIURETIC PEPTIDE: B Natriuretic Peptide: 42.7 pg/mL (ref 0.0–100.0)

## 2020-01-11 LAB — PROCALCITONIN: Procalcitonin: 21.5 ng/mL

## 2020-01-11 LAB — D-DIMER, QUANTITATIVE: D-Dimer, Quant: 0.42 ug/mL-FEU (ref 0.00–0.50)

## 2020-01-11 LAB — FERRITIN: Ferritin: 995 ng/mL — ABNORMAL HIGH (ref 11–307)

## 2020-01-11 MED ORDER — POLYVINYL ALCOHOL 1.4 % OP SOLN
1.0000 [drp] | OPHTHALMIC | Status: DC | PRN
  Filled 2020-01-11: qty 15

## 2020-01-11 MED ORDER — SALINE SPRAY 0.65 % NA SOLN
1.0000 | NASAL | Status: DC | PRN
  Filled 2020-01-11: qty 44

## 2020-01-11 MED ORDER — PHENOL 1.4 % MT LIQD
1.0000 | OROMUCOSAL | Status: DC | PRN
  Filled 2020-01-11 (×2): qty 177

## 2020-01-11 MED ORDER — ALBUTEROL SULFATE HFA 108 (90 BASE) MCG/ACT IN AERS
2.0000 | INHALATION_SPRAY | Freq: Four times a day (QID) | RESPIRATORY_TRACT | Status: DC
  Administered 2020-01-11 – 2020-01-15 (×16): 2 via RESPIRATORY_TRACT
  Filled 2020-01-11: qty 6.7

## 2020-01-11 NOTE — Evaluation (Signed)
Physical Therapy Evaluation Patient Details Name: Kendra Collins MRN: [id number] DOB: 02-05-1966 Today's Date: 01/11/2020   History of Present Illness  Patient is a 54 y.o. female with PMHx of bronchial asthma-who was diagnosed with COVID-19 on 4/9 (per [facility] ED documentation)-transfer to [facility] from [place] for evaluation of acute hypoxemic respiratory failure secondary to COVID-19 pneumonia  Clinical Impression  Pt with limited pulmonary endurance walking a short distance around the room on 9 L O2 [place] with 3/4 DOE and O2 dropping to 85% with < 3 min recovery to get back to 88-low 90s.  Pt with some mild gait instability, but compensates well furniture walking.  I anticipate this will get better the better she gets medically.  PT will continue to follow acutely for safe mobility progression.  Bring HEP next session.    Follow Up Recommendations Home health PT    Equipment Recommendations  Other (comment)(possible home O2)    Recommendations for Other Services   NA    Precautions / Restrictions Precautions Precautions: Other (comment) Precaution Comments: monitor O2 sats      Mobility  Bed Mobility               General bed mobility comments: Pt OOB in the recliner chair  Transfers Overall transfer level: Needs assistance Equipment used: None Transfers: Sit to/from Stand Sit to Stand: Supervision         General transfer comment: Supervision for safety  Ambulation/Gait Ambulation/Gait assistance: Min guard Gait Distance (Feet): 20 Feet Assistive device: None Gait Pattern/deviations: Step-through pattern;Staggering right;Staggering left Gait velocity: decrease Gait velocity interpretation: <1.8 ft/sec, indicate of risk for recurrent falls General Gait Details: Pt with mildly staggering gait pattern, reaching for furniture lightly for balance requiring min guard for balance when something is not in reach.  DOE 3/4 on 9L O2 [place] O2 sats dropped to 85%  on this level increasing back to low 90s with seated rest in <3 mins.          Balance Overall balance assessment: Needs assistance Sitting-balance support: Feet supported;Bilateral upper extremity supported Sitting balance-Leahy Scale: Good     Standing balance support: Bilateral upper extremity supported;Single extremity supported Standing balance-Leahy Scale: Fair Standing balance comment: close supervision to min guard for dynamic activities                             Pertinent Vitals/Pain Pain Assessment: Faces Faces Pain Scale: Hurts even more Pain Location: eyes, nose    Home Living Family/patient expects to be discharged to:: Private residence Living Arrangements: Spouse/significant other;Children(14 y.o. son and husband) Available Help at Discharge: Family Type of Home: Mobile home Home Access: Stairs to enter Entrance Stairs-Rails: Right;Left;Can reach both Entrance Stairs-Number of Steps: 3 Home Layout: One level Home Equipment: None      Prior Function Level of Independence: Independent               Hand Dominance   Dominant Hand: Right    Extremity/Trunk Assessment   Upper Extremity Assessment Upper Extremity Assessment: Defer to OT evaluation    Lower Extremity Assessment Lower Extremity Assessment: Generalized weakness    Cervical / Trunk Assessment Cervical / Trunk Assessment: Normal  Communication   Communication: No difficulties  Cognition Arousal/Alertness: Awake/alert Behavior During Therapy: WFL for tasks assessed/performed Overall Cognitive Status: Within Functional Limits for tasks assessed  Exercises Other Exercises Other Exercises: IS and Flutter valve x 10 each max inspired volume 300 mL   Assessment/Plan    PT Assessment Patient needs continued PT services  PT Problem List Decreased strength;Decreased activity tolerance;Decreased  balance;Decreased mobility;Decreased knowledge of use of DME;Cardiopulmonary status limiting activity;Decreased knowledge of precautions       PT Treatment Interventions DME instruction;Gait training;Stair training;Functional mobility training;Therapeutic activities;Therapeutic exercise;Balance training;Patient/family education    PT Goals (Current goals can be found in the Care Plan section)  Acute Rehab PT Goals Patient Stated Goal: home to family PT Goal Formulation: With patient Time For Goal Achievement: 01/25/20 Potential to Achieve Goals: Good    Frequency Min 3X/week   Barriers to discharge Decreased caregiver support(husband is very sick at home)         AM-PAC PT "6 Clicks" Mobility  Outcome Measure Help needed turning from your back to your side while in a flat bed without using bedrails?: None Help needed moving from lying on your back to sitting on the side of a flat bed without using bedrails?: None Help needed moving to and from a bed to a chair (including a wheelchair)?: A Little Help needed standing up from a chair using your arms (e.g., wheelchair or bedside chair)?: None Help needed to walk in hospital room?: A Little Help needed climbing 3-5 steps with a railing? : A Little 6 Click Score: 21    End of Session Equipment Utilized During Treatment: Oxygen(9L O2 [place]) Activity Tolerance: Patient limited by fatigue;Other (comment)(Limited by DOE) Patient left: in chair;with call bell/phone within reach   PT Visit Diagnosis: Muscle weakness (generalized) (M62.81);Difficulty in walking, not elsewhere classified (R26.2)    Time: 7824-2353 PT Time Calculation (min) (ACUTE ONLY): 23 min   Charges:         Verdene Lennert, PT, DPT  Acute Rehabilitation [phone] pager #[phone] office     PT Evaluation $PT Eval Low Complexity: 1 Low PT Treatments $Gait Training: 8-22 mins       01/11/2020, 2:18 PM

## 2020-01-11 NOTE — Progress Notes (Addendum)
Occupational Therapy Evaluation   This 54 y/o female presents with the below deficits. Pt presents seated EOB pleasant and willing to work with therapies. Pt mostly with limitations due to generalized weakness and decreased cardiorespiratory status impacting her ability to perform ADL/mobility tasks. Pt completing functional transfers without AD at [facility] assist level today, though with notable increase in WOB with transfers as well as when sitting/speaking. Pt initially on 7L HFNC with SpO2 decreasing to 85% post transfer, returning to 90% within approx 1 min and cues for deep breathing. MD in during session and increasing O2 levels to 9L HFNC given continued WOB while sitting and with improvements noted. Pt reports she lives at home with her spouse and 77 y/o son, was independent PTA. Pt will benefit from continued acute OT services and currently recommend follow up [facility] services after discharge to progress pt towards her PLOF. Will follow.    01/11/20 0800  OT Visit Information  Last OT Received On 01/11/20  Assistance Needed +1  History of Present Illness Patient is a 54 y.o. female with PMHx of bronchial asthma-who was diagnosed with COVID-19 on 4/9 (per [facility] ED documentation)-transfer to [facility] from [place] for evaluation of acute hypoxemic respiratory failure secondary to COVID-19 pneumonia  Precautions  Precautions Other (comment)  Precaution Comments monitor O2 sats  Restrictions  Weight Bearing Restrictions No  Home Living  Family/patient expects to be discharged to: Private residence  Living Arrangements Spouse/significant other;Children (24 y/o son and husband)  Available Help at Discharge Family  Type of Home Mobile home  Home Access Stairs to enter  Entrance Stairs-Number of Steps 3  Entrance Stairs-Rails Right;Left;Can reach both  Home Layout One level  Bathroom Shower/Tub Tub/shower unit  Automotive engineer None  Prior Function   Level of Independence Independent  Communication  Communication No difficulties  Pain Assessment  Pain Assessment Faces  Faces Pain Scale 2  Pain Location general discomfort, has had a cough  Pain Descriptors / Indicators Discomfort  Pain Intervention(s) Monitored during session  Cognition  Arousal/Alertness Awake/alert  Behavior During Therapy WFL for tasks assessed/performed  Overall Cognitive Status Within Functional Limits for tasks assessed  Upper Extremity Assessment  Upper Extremity Assessment Generalized weakness  Lower Extremity Assessment  Lower Extremity Assessment Defer to PT evaluation  ADL  Overall ADL's  Needs assistance/impaired  Eating/Feeding Modified independent;Sitting  Eating/Feeding Details (indicate cue type and reason) completing breakfast upon arrival  Grooming Set up;Sitting  Upper Body Bathing Set up;Min guard;Sitting  Lower Body Bathing Min guard;Sit to/from stand  Upper Body Dressing  Set up;Sitting  Lower Body Dressing Min guard;Sit to/from Chiropractor Details (indicate cue type and reason) simulated via transfer to [place] and Hygiene Min guard;Sit to/from stand  Functional mobility during ADLs Min guard (stand pivot transfers)  General ADL Comments pt mostly with limitations due to decreased activity tolerance and poor cardiorespiratory status  Bed Mobility  General bed mobility comments seated EOB upon arrival  Transfers  Overall transfer level Needs assistance  Equipment used None  Transfers Sit to/from Stand;Stand Pivot Transfers  Sit to Stand Supervision  Stand pivot transfers Min guard  General transfer comment for lines and safety  Balance  Overall balance assessment Mild deficits observed, not formally tested  Exercises  Exercises Other exercises  Other Exercises  Other Exercises use of IS and flutter valve, x5 reps, pt pulling approx 500 on IS  OT - End of Session  Equipment Utilized During Treatment Oxygen  Activity Tolerance Patient tolerated treatment well;Patient limited by fatigue  Nurse Communication Mobility status  OT Assessment  OT Recommendation/Assessment Patient needs continued OT Services  OT Visit Diagnosis Muscle weakness (generalized) (M62.81);Other (comment) (decreased activity tolerance)  OT Problem List Decreased strength;Decreased activity tolerance;Impaired balance (sitting and/or standing);Cardiopulmonary status limiting activity;Obesity;Decreased knowledge of use of DME or AE  OT Plan  OT Frequency (ACUTE ONLY) Min 2X/week  OT Treatment/Interventions (ACUTE ONLY) Self-care/ADL training;Therapeutic exercise;Energy conservation;DME and/or AE instruction;Therapeutic activities;Patient/family education;Balance training  AM-PAC OT "6 Clicks" Daily Activity Outcome Measure (Version 2)  Help from another person eating meals? 4  Help from another person taking care of personal grooming? 3  Help from another person toileting, which includes using toliet, bedpan, or urinal? 3  Help from another person bathing (including washing, rinsing, drying)? 3  Help from another person to put on and taking off regular upper body clothing? 3  Help from another person to put on and taking off regular lower body clothing? 3  6 Click Score 19  OT Recommendation  Follow Up Recommendations Home health OT;Supervision/Assistance - 24 hour (vs no follow-up pending progress)  OT Equipment Tub/shower seat (pending progress/activity tolerance at d/c)  Individuals Consulted  Consulted and Agree with Results and Recommendations Patient  Acute Rehab OT Goals  Patient Stated Goal home to family  OT Goal Formulation With patient  Time For Goal Achievement 01/25/20  Potential to Achieve Goals Good  OT Time Calculation  OT Start Time (ACUTE ONLY) 0844  OT Stop Time (ACUTE ONLY) 0909 (-approx 6 min for MD/xray)  OT Time Calculation (min)  25 min  OT General Charges  $OT Visit 1 Visit  OT Evaluation  $OT Eval Moderate Complexity 1 Mod    Marcy Siren, OT Acute Rehabilitation Services Pager [phone] Office [phone]

## 2020-01-11 NOTE — Progress Notes (Signed)
PROGRESS NOTE                                                                                                                                                                                                             Patient Demographics:    Kendra Collins, is a 54 y.o. female, DOB - 06-08-1966, ZOX:[id number]  Outpatient Primary MD for the patient is System, Pcp Not In   Admit date - 01/09/2020   LOS - 2  No chief complaint on file.      Brief Narrative: Patient is a 54 y.o. female with PMHx of bronchial asthma-who was diagnosed with COVID-19 on 4/9 (per [facility] ED documentation)-transfer to [facility] from [facility] for evaluation of acute hypoxemic respiratory failure secondary to COVID-19 pneumonia  Significant Events: 4/9>> Covid positive at local urgent care in [place] (per documentation from [facility]) 4/22>> transferred from [place] [facility] for hypoxia secondary to COVID-19/bacterial pneumonia  COVID-19 medications: Steroids: 4/22>> Remdesivir: 4/22>>  Antibiotics: Rocephin: 4/22>> Zithromax: 4/22>>  Microbiology data: Blood culture: 4/22>> ordered  DVT prophylaxis: SQ Lovenox  Procedures: None  Consults: None    Subjective:   Patient in bed, appears comfortable, denies any headache, no fever, no chest pain or pressure, +ve shortness of breath , no abdominal pain. No focal weakness.    Assessment  & Plan :   Acute Hypoxic Resp Failure due to Covid 19 Viral pneumonia and concurrent bacterial pneumonia: She COVID-19 pneumonia along with most likely superimposed bacterial infection suggested by extremely elevated procalcitonin levels.  She has been appropriately started on high-dose IV steroids, remdesivir along with antibiotics for bacterial component of the infection.  Procalcitonin is trending down, CRP is trending down, she still has quite a bit of oxygen requirement, will be  monitored closely.  Mild wheezing will monitor BNP as well.  Try to sit up in the chair in the daytime and prone in bed at night, use I-S and flutter valve for pulmonary toiletry 3-4 times every hour while awake.    Recent Labs  Lab 01/10/20 0539 01/11/20 0232  NA 139 141  K 3.8 3.9  CL 105 107  CO2 22 22  GLUCOSE 107* 154*  BUN 12 13  CREATININE 0.61 0.49  CALCIUM 8.4* 8.3*  AST 40 24  ALT 50* 37  ALKPHOS 94 84  BILITOT 0.6 0.3  ALBUMIN 2.3* 2.2*  MG 2.2  --   CRP 13.1* 4.2*  DDIMER 0.54* 0.42  PROCALCITON 36.89 21.50    Recent Labs  Lab 01/10/20 0539 01/11/20 0232  CRP 13.1* 4.2*  DDIMER 0.54* 0.42  PROCALCITON 36.89 21.50       Transaminitis: Mild due to viral infection, asymptomatic will trend.  Bronchial asthma: No exacerbation-continue bronchodilators and IV steroids, does have mild wheezing, will monitor BNP as well.  Morbid Obesity: BMI of 36 follow with PCP for weight loss.       Condition -  Guarded  Family Communication  :  Spouse updated over the [phone]  phone 01/11/19  Code Status :  Full Code  Diet :  Diet Order            Diet Heart Room service appropriate? Yes; Fluid consistency: Thin  Diet effective now               Disposition Plan  : Remain in the hospital, still has severe hypoxic respiratory failure requiring multiple liters of oxygen.  Possible discharge home once she she has recovered from severe hypoxic respiratory failure.    Antimicorbials  :    Anti-infectives (From admission, onward)   Start     Dose/Rate Route Frequency Ordered Stop   01/10/20 1000  remdesivir 100 mg in sodium chloride 0.9 % 100 mL IVPB     100 mg 200 mL/hr over 30 Minutes Intravenous Daily 01/09/20 2138 01/14/20 0959   01/09/20 2115  azithromycin (ZITHROMAX) 500 mg in sodium chloride 0.9 % 250 mL IVPB     500 mg 250 mL/hr over 60 Minutes Intravenous Every 24 hours 01/09/20 2009 01/14/20 2159   01/09/20 2100  cefTRIAXone (ROCEPHIN) 2 g in  sodium chloride 0.9 % 100 mL IVPB     2 g 200 mL/hr over 30 Minutes Intravenous Every 24 hours 01/09/20 2009 01/14/20 2059      Inpatient Medications  Scheduled Meds: . enoxaparin (LOVENOX) injection  40 mg Subcutaneous Q24H  . mouth rinse  15 mL Mouth Rinse BID  . methylPREDNISolone (SOLU-MEDROL) injection  40 mg Intravenous Q12H  . sodium chloride flush  3 mL Intravenous Q12H   Continuous Infusions: . azithromycin 500 mg (01/10/20 2135)  . cefTRIAXone (ROCEPHIN)  IV 2 g (01/10/20 2133)  . remdesivir 100 mg in NS 100 mL 100 mg (01/10/20 0819)   PRN Meds:.acetaminophen, albuterol, alum & mag hydroxide-simeth, guaiFENesin-dextromethorphan, HYDROcodone-acetaminophen, [DISCONTINUED] ondansetron **OR** ondansetron (ZOFRAN) IV, senna-docusate   Time Spent in minutes  35  See all Orders from today for further details   Lala Lund M.D on 01/11/2020 at 9:41 AM  To page go to [email or web address] - use universal password  [facility] -  Office  [phone]    Objective:   Vitals:   01/10/20 1923 01/10/20 2308 01/11/20 0343 01/11/20 0800  BP: 131/78 127/73 119/67   Pulse:      Resp:      Temp: 98 F (36.7 C) 98.3 F (36.8 C) 98 F (36.7 C) 97.9 F (36.6 C)  TempSrc:    Oral  SpO2:      Weight:      Height:        Wt Readings from Last 3 Encounters:  01/09/20 88.1 kg  09/29/16 84.8 kg  09/08/16 84.8 kg     Intake/Output Summary (Last 24 hours) at 01/11/2020 0941 Last data filed at 01/11/2020 0600 Gross per 24 hour  Intake 659.92  ml  Output --  Net 659.92 ml     Physical Exam  Awake Alert, No new F.N deficits, Normal affect [place].AT,PERRAL Supple Neck,No JVD, No cervical lymphadenopathy appriciated.  Symmetrical Chest wall movement, Good air movement bilaterally, CTAB RRR,No Gallops, Rubs or new Murmurs, No Parasternal Heave +ve B.Sounds, Abd Soft, No tenderness, No organomegaly appriciated, No rebound - guarding or rigidity. No Cyanosis, Clubbing or  edema, No new Rash or bruise    Data Review:    CBC Recent Labs  Lab 01/10/20 0539 01/11/20 0232  WBC 6.2 7.1  HGB 14.5 13.6  HCT 44.0 41.8  PLT 453* 465*  MCV 83.7 82.9  MCH 27.6 27.0  MCHC 33.0 32.5  RDW 13.4 13.4  LYMPHSABS 1.3 1.2  MONOABS 0.4 0.3  EOSABS 0.0 0.0  BASOSABS 0.1 0.0    Chemistries  Recent Labs  Lab 01/10/20 0539 01/11/20 0232  NA 139 141  K 3.8 3.9  CL 105 107  CO2 22 22  GLUCOSE 107* 154*  BUN 12 13  CREATININE 0.61 0.49  CALCIUM 8.4* 8.3*  MG 2.2  --   AST 40 24  ALT 50* 37  ALKPHOS 94 84  BILITOT 0.6 0.3   ------------------------------------------------------------------------------------------------------------------ No results for input(s): CHOL, HDL, LDLCALC, TRIG, CHOLHDL, LDLDIRECT in the last 72 hours.  No results found for: HGBA1C ------------------------------------------------------------------------------------------------------------------ No results for input(s): TSH, T4TOTAL, T3FREE, THYROIDAB in the last 72 hours.  Invalid input(s): FREET3 ------------------------------------------------------------------------------------------------------------------ Recent Labs    01/10/20 0539 01/11/20 0232  FERRITIN 1,351* 995*    Coagulation profile No results for input(s): INR, PROTIME in the last 168 hours.  Recent Labs    01/10/20 0539 01/11/20 0232  DDIMER 0.54* 0.42    Cardiac Enzymes No results for input(s): CKMB, TROPONINI, MYOGLOBIN in the last 168 hours.  Invalid input(s): CK ------------------------------------------------------------------------------------------------------------------ No results found for: BNP  Micro Results Recent Results (from the past 240 hour(s))  MRSA PCR Screening     Status: None   Collection Time: 01/10/20  5:56 AM   Specimen: Nasal Mucosa; Nasopharyngeal  Result Value Ref Range Status   MRSA by PCR NEGATIVE NEGATIVE Final    Comment:        The GeneXpert MRSA Assay  (FDA approved for NASAL specimens only), is one component of a comprehensive MRSA colonization surveillance program. It is not intended to diagnose MRSA infection nor to guide or monitor treatment for MRSA infections. Performed at [facility], [address], [place], [place] [address]     Radiology Reports DG Chest [place] 1 View  Result Date: 01/11/2020 CLINICAL DATA:  54 year old female with COVID pneumonia EXAM: PORTABLE CHEST 1 VIEW COMPARISON:  Prior chest x-ray 01/09/2020 FINDINGS: Improved inspiratory volumes. Significant interval improved vulvar in diffuse bilateral patchy interstitial and airspace opacities in a predominantly peripheral and basilar distribution. Stable cardiac and mediastinal contours. No acute osseous abnormality. No large effusion or pneumothorax. IMPRESSION: Improving multifocal viral pneumonia. Improved inspiratory volume. Electronically Signed   By: Malachy Moan M.D.   On: 01/11/2020 09:32

## 2020-01-12 LAB — PROCALCITONIN: Procalcitonin: 6.91 ng/mL

## 2020-01-12 LAB — COMPREHENSIVE METABOLIC PANEL
ALT: 30 U/L (ref 0–44)
AST: 19 U/L (ref 15–41)
Albumin: 2.4 g/dL — ABNORMAL LOW (ref 3.5–5.0)
Alkaline Phosphatase: 86 U/L (ref 38–126)
Anion gap: 11 (ref 5–15)
BUN: 11 mg/dL (ref 6–20)
CO2: 22 mmol/L (ref 22–32)
Calcium: 8.5 mg/dL — ABNORMAL LOW (ref 8.9–10.3)
Chloride: 108 mmol/L (ref 98–111)
Creatinine, Ser: 0.56 mg/dL (ref 0.44–1.00)
GFR calc Af Amer: >60 mL/min (ref >60)
GFR calc non Af Amer: >60 mL/min (ref >60)
Glucose, Bld: 160 mg/dL — ABNORMAL HIGH (ref 70–99)
Potassium: 3.9 mmol/L (ref 3.5–5.1)
Sodium: 141 mmol/L (ref 135–145)
Total Bilirubin: 0.6 mg/dL (ref 0.3–1.2)
Total Protein: 6 g/dL — ABNORMAL LOW (ref 6.5–8.1)

## 2020-01-12 LAB — CBC WITH DIFFERENTIAL/PLATELET
Abs Immature Granulocytes: 0.25 10*3/uL — ABNORMAL HIGH (ref 0.00–0.07)
Basophils Absolute: 0 10*3/uL (ref 0.0–0.1)
Basophils Relative: 0 %
Eosinophils Absolute: 0 10*3/uL (ref 0.0–0.5)
Eosinophils Relative: 0 %
HCT: 42.5 % (ref 36.0–46.0)
Hemoglobin: 14 g/dL (ref 12.0–15.0)
Immature Granulocytes: 3 %
Lymphocytes Relative: 12 %
Lymphs Abs: 1.2 10*3/uL (ref 0.7–4.0)
MCH: 27.4 pg (ref 26.0–34.0)
MCHC: 32.9 g/dL (ref 30.0–36.0)
MCV: 83.2 fL (ref 80.0–100.0)
Monocytes Absolute: 0.6 10*3/uL (ref 0.1–1.0)
Monocytes Relative: 6 %
Neutro Abs: 7.5 10*3/uL (ref 1.7–7.7)
Neutrophils Relative %: 79 %
Platelets: 559 10*3/uL — ABNORMAL HIGH (ref 150–400)
RBC: 5.11 MIL/uL (ref 3.87–5.11)
RDW: 13.2 % (ref 11.5–15.5)
WBC: 9.5 10*3/uL (ref 4.0–10.5)
nRBC: 0 % (ref 0.0–0.2)

## 2020-01-12 LAB — D-DIMER, QUANTITATIVE: D-Dimer, Quant: 3.9 ug/mL-FEU — ABNORMAL HIGH (ref 0.00–0.50)

## 2020-01-12 LAB — BRAIN NATRIURETIC PEPTIDE: B Natriuretic Peptide: 72.8 pg/mL (ref 0.0–100.0)

## 2020-01-12 LAB — C-REACTIVE PROTEIN: CRP: 1.5 mg/dL — ABNORMAL HIGH (ref <1.0)

## 2020-01-12 MED ORDER — ENOXAPARIN SODIUM 40 MG/0.4ML ~~LOC~~ SOLN
40.0000 mg | Freq: Two times a day (BID) | SUBCUTANEOUS | Status: DC
  Administered 2020-01-13 – 2020-01-14 (×3): 40 mg via SUBCUTANEOUS
  Filled 2020-01-12 (×3): qty 0.4

## 2020-01-12 MED ORDER — LOPERAMIDE HCL 2 MG PO CAPS
4.0000 mg | ORAL_CAPSULE | Freq: Four times a day (QID) | ORAL | Status: DC | PRN
  Administered 2020-01-12: 4 mg via ORAL
  Filled 2020-01-12: qty 2

## 2020-01-12 MED ORDER — FUROSEMIDE 10 MG/ML IJ SOLN
40.0000 mg | Freq: Once | INTRAMUSCULAR | Status: DC
  Filled 2020-01-12: qty 4

## 2020-01-12 MED ORDER — HYDROCODONE-ACETAMINOPHEN 5-325 MG PO TABS: 1.0000 | ORAL_TABLET | ORAL | Status: DC | PRN

## 2020-01-12 MED ORDER — FUROSEMIDE 10 MG/ML IJ SOLN
40.0000 mg | Freq: Once | INTRAMUSCULAR | Status: AC
  Administered 2020-01-12: 40 mg via INTRAVENOUS

## 2020-01-12 MED ORDER — CLONAZEPAM 0.25 MG PO TBDP
0.5000 mg | ORAL_TABLET | Freq: Two times a day (BID) | ORAL | Status: DC
  Administered 2020-01-12 – 2020-01-15 (×7): 0.5 mg via ORAL
  Filled 2020-01-12 (×7): qty 2

## 2020-01-12 NOTE — Progress Notes (Signed)
PROGRESS NOTE                                                                                                                                                                                                             Patient Demographics:    Kendra Collins, is a 54 y.o. female, DOB - October 17, 1965, ZOX:[id number]  Outpatient Primary MD for the patient is System, Pcp Not In   Admit date - 01/09/2020   LOS - 3  No chief complaint on file.      Brief Narrative: Patient is a 54 y.o. female with PMHx of bronchial asthma-who was diagnosed with COVID-19 on 4/9 (per [facility] ED documentation)-transfer to [facility] from [place] for evaluation of acute hypoxemic respiratory failure secondary to COVID-19 pneumonia  Significant Events: 4/9>> Covid positive at local urgent care in [place] (per documentation from [facility]) 4/22>> transferred from [place] [facility] for hypoxia secondary to COVID-19/bacterial pneumonia  COVID-19 medications: Steroids: 4/22>> Remdesivir: 4/22>>  Antibiotics: Rocephin: 4/22>> Zithromax: 4/22>>  Microbiology data: Blood culture: 4/22>> ordered  DVT prophylaxis: SQ Lovenox  Procedures: None  Consults: None    Subjective:   Patient in bed, appears comfortable, denies any headache, no fever, no chest pain or pressure, no shortness of breath , no abdominal pain. No focal weakness.   Assessment  & Plan :   Acute Hypoxic Resp Failure due to Covid 19 Viral pneumonia and concurrent bacterial pneumonia: She COVID-19 pneumonia along with most likely superimposed bacterial infection suggested by extremely elevated procalcitonin levels.  She has been appropriately started on high-dose IV steroids, remdesivir along with antibiotics for bacterial component of the infection.  Still severely hypoxic continue present treatment, has developed a few rails for which I will give her IV Lasix on  01/12/2020.  Encouraged the patient to sit up in the chair in the daytime and prone in bed at night, use I-S and flutter valve for pulmonary toiletry 3-4 times every hour while awake.    Recent Labs  Lab 01/10/20 0539 01/11/20 0232 01/12/20 0808  NA 139 141 141  K 3.8 3.9 3.9  CL 105 107 108  CO2 22 22 22   GLUCOSE 107* 154* 160*  BUN 12 13 11   CREATININE 0.61 0.49 0.56  CALCIUM 8.4* 8.3* 8.5*  AST 40 24 19  ALT 50* 37 30  ALKPHOS 94 84 86  BILITOT 0.6 0.3 0.6  ALBUMIN 2.3* 2.2* 2.4*  MG 2.2  --   --   CRP 13.1* 4.2* 1.5*  DDIMER 0.54* 0.42 3.90*  PROCALCITON 36.89 21.50 6.91  BNP  --  42.7 72.8     Transaminitis: Mild due to viral infection, asymptomatic will trend.  Bronchial asthma: No exacerbation-continue bronchodilators and IV steroids, does have mild wheezing, will monitor BNP as well.  Morbid Obesity: BMI of 36 follow with PCP for weight loss.   Rapidly rising D-dimer due to inflammation from COVID-19.  High risk for developing a blood clot.  Advance to moderate dose Lovenox.  Anxiety.  Add Klonopin.     Condition -  Guarded  Family Communication  :  Spouse updated over the [phone]  phone 01/11/19  Code Status :  Full Code  Diet :  Diet Order            Diet Heart Room service appropriate? Yes; Fluid consistency: Thin  Diet effective now               Disposition Plan  : Remain in the hospital, still has severe hypoxic respiratory failure requiring multiple liters of oxygen.  Possible discharge home once she she has recovered from severe hypoxic respiratory failure.    Antimicorbials  :    Anti-infectives (From admission, onward)   Start     Dose/Rate Route Frequency Ordered Stop   01/10/20 1000  remdesivir 100 mg in sodium chloride 0.9 % 100 mL IVPB     100 mg 200 mL/hr over 30 Minutes Intravenous Daily 01/09/20 2138 01/14/20 0959   01/09/20 2115  azithromycin (ZITHROMAX) 500 mg in sodium chloride 0.9 % 250 mL IVPB     500 mg 250 mL/hr  over 60 Minutes Intravenous Every 24 hours 01/09/20 2009 01/14/20 2159   01/09/20 2100  cefTRIAXone (ROCEPHIN) 2 g in sodium chloride 0.9 % 100 mL IVPB     2 g 200 mL/hr over 30 Minutes Intravenous Every 24 hours 01/09/20 2009 01/14/20 2059      Inpatient Medications  Scheduled Meds: . albuterol  2 puff Inhalation Q6H  . clonazepam  0.5 mg Oral BID  . enoxaparin (LOVENOX) injection  40 mg Subcutaneous Q24H  . furosemide  40 mg Intravenous Once  . mouth rinse  15 mL Mouth Rinse BID  . methylPREDNISolone (SOLU-MEDROL) injection  40 mg Intravenous Q12H  . sodium chloride flush  3 mL Intravenous Q12H   Continuous Infusions: . azithromycin 500 mg (01/11/20 2122)  . cefTRIAXone (ROCEPHIN)  IV 2 g (01/11/20 2007)  . remdesivir 100 mg in NS 100 mL 100 mg (01/11/20 1019)   PRN Meds:.acetaminophen, albuterol, alum & mag hydroxide-simeth, guaiFENesin-dextromethorphan, HYDROcodone-acetaminophen, [DISCONTINUED] ondansetron **OR** ondansetron (ZOFRAN) IV, phenol, polyvinyl alcohol, senna-docusate, sodium chloride   Time Spent in minutes  35  See all Orders from today for further details   Susa Raring M.D on 01/12/2020 at 10:32 AM  To page go to [email or web address] - use universal password  [facility] -  Office  [phone]    Objective:   Vitals:   01/11/20 2009 01/11/20 2350 01/12/20 0323 01/12/20 0730  BP: 127/84 119/76 110/63 131/72  Pulse:    76  Resp:    20  Temp: 97.9 F (36.6 C) 98 F (36.7 C) 98.6 F (37 C)   TempSrc:   Axillary   SpO2:    91%  Weight:      Height:  Wt Readings from Last 3 Encounters:  01/09/20 88.1 kg  09/29/16 84.8 kg  09/08/16 84.8 kg    No intake or output data in the 24 hours ending 01/12/20 1032   Physical Exam  Awake Alert, No new F.N deficits, Normal affect [place].AT,PERRAL Supple Neck,No JVD, No cervical lymphadenopathy appriciated.  Symmetrical Chest wall movement, Good air movement bilaterally, few rales RRR,No Gallops,  Rubs or new Murmurs, No Parasternal Heave +ve B.Sounds, Abd Soft, No tenderness, No organomegaly appriciated, No rebound - guarding or rigidity. No Cyanosis, Clubbing or edema, No new Rash or bruise    Data Review:    CBC Recent Labs  Lab 01/10/20 0539 01/11/20 0232 01/12/20 0808  WBC 6.2 7.1 9.5  HGB 14.5 13.6 14.0  HCT 44.0 41.8 42.5  PLT 453* 465* 559*  MCV 83.7 82.9 83.2  MCH 27.6 27.0 27.4  MCHC 33.0 32.5 32.9  RDW 13.4 13.4 13.2  LYMPHSABS 1.3 1.2 1.2  MONOABS 0.4 0.3 0.6  EOSABS 0.0 0.0 0.0  BASOSABS 0.1 0.0 0.0    Chemistries  Recent Labs  Lab 01/10/20 0539 01/11/20 0232 01/12/20 0808  NA 139 141 141  K 3.8 3.9 3.9  CL 105 107 108  CO2 22 22 22   GLUCOSE 107* 154* 160*  BUN 12 13 11   CREATININE 0.61 0.49 0.56  CALCIUM 8.4* 8.3* 8.5*  MG 2.2  --   --   AST 40 24 19  ALT 50* 37 30  ALKPHOS 94 84 86  BILITOT 0.6 0.3 0.6   ------------------------------------------------------------------------------------------------------------------ No results for input(s): CHOL, HDL, LDLCALC, TRIG, CHOLHDL, LDLDIRECT in the last 72 hours.  No results found for: HGBA1C ------------------------------------------------------------------------------------------------------------------ No results for input(s): TSH, T4TOTAL, T3FREE, THYROIDAB in the last 72 hours.  Invalid input(s): FREET3 ------------------------------------------------------------------------------------------------------------------ Recent Labs    01/10/20 0539 01/11/20 0232  FERRITIN 1,351* 995*    Coagulation profile No results for input(s): INR, PROTIME in the last 168 hours.  Recent Labs    01/11/20 0232 01/12/20 0808  DDIMER 0.42 3.90*    Cardiac Enzymes No results for input(s): CKMB, TROPONINI, MYOGLOBIN in the last 168 hours.  Invalid input(s): CK ------------------------------------------------------------------------------------------------------------------    Component  Value Date/Time   BNP 72.8 01/12/2020 0808    Micro Results Recent Results (from the past 240 hour(s))  MRSA PCR Screening     Status: None   Collection Time: 01/10/20  5:56 AM   Specimen: Nasal Mucosa; Nasopharyngeal  Result Value Ref Range Status   MRSA by PCR NEGATIVE NEGATIVE Final    Comment:        The GeneXpert MRSA Assay (FDA approved for NASAL specimens only), is one component of a comprehensive MRSA colonization surveillance program. It is not intended to diagnose MRSA infection nor to guide or monitor treatment for MRSA infections. Performed at [facility], [address], [place], [address] [place]   Culture, blood (routine x 2)     Status: None (Preliminary result)   Collection Time: 01/10/20 11:35 AM   Specimen: BLOOD  Result Value Ref Range Status   Specimen Description BLOOD LEFT ANTECUBITAL  Final   Special Requests   Final    BOTTLES DRAWN AEROBIC AND ANAEROBIC Blood Culture adequate volume   Culture   Final    NO GROWTH < 24 HOURS Performed at [facility], [address], [place], [address] [place]    Report Status PENDING  Incomplete  Culture, blood (routine x 2)  Status: None (Preliminary result)   Collection Time: 01/10/20 11:40 AM   Specimen: BLOOD RIGHT HAND  Result Value Ref Range Status   Specimen Description BLOOD RIGHT HAND  Final   Special Requests   Final    BOTTLES DRAWN AEROBIC ONLY Blood Culture adequate volume   Culture   Final    NO GROWTH < 24 HOURS Performed at [facility], [address], [place], [place] [address]    Report Status PENDING  Incomplete    Radiology Reports DG Chest Port 1 View  Result Date: 01/11/2020 CLINICAL DATA:  54 year old female with COVID pneumonia EXAM: PORTABLE CHEST 1 VIEW COMPARISON:  Prior chest x-ray 01/09/2020 FINDINGS: Improved inspiratory volumes. Significant interval improved vulvar in diffuse bilateral patchy interstitial and airspace opacities in a predominantly  peripheral and basilar distribution. Stable cardiac and mediastinal contours. No acute osseous abnormality. No large effusion or pneumothorax. IMPRESSION: Improving multifocal viral pneumonia. Improved inspiratory volume. Electronically Signed   By: Jacqulynn Cadet M.D.   On: 01/11/2020 09:32

## 2020-01-13 LAB — CBC WITH DIFFERENTIAL/PLATELET
Abs Immature Granulocytes: 0.39 10*3/uL — ABNORMAL HIGH (ref 0.00–0.07)
Basophils Absolute: 0 10*3/uL (ref 0.0–0.1)
Basophils Relative: 0 %
Eosinophils Absolute: 0 10*3/uL (ref 0.0–0.5)
Eosinophils Relative: 0 %
HCT: 40.4 % (ref 36.0–46.0)
Hemoglobin: 13.4 g/dL (ref 12.0–15.0)
Immature Granulocytes: 4 %
Lymphocytes Relative: 9 %
Lymphs Abs: 0.9 10*3/uL (ref 0.7–4.0)
MCH: 27.5 pg (ref 26.0–34.0)
MCHC: 33.2 g/dL (ref 30.0–36.0)
MCV: 83 fL (ref 80.0–100.0)
Monocytes Absolute: 0.5 10*3/uL (ref 0.1–1.0)
Monocytes Relative: 5 %
Neutro Abs: 8.2 10*3/uL — ABNORMAL HIGH (ref 1.7–7.7)
Neutrophils Relative %: 82 %
Platelets: 521 10*3/uL — ABNORMAL HIGH (ref 150–400)
RBC: 4.87 MIL/uL (ref 3.87–5.11)
RDW: 13.2 % (ref 11.5–15.5)
WBC: 10 10*3/uL (ref 4.0–10.5)
nRBC: 0 % (ref 0.0–0.2)

## 2020-01-13 LAB — COMPREHENSIVE METABOLIC PANEL
ALT: 28 U/L (ref 0–44)
AST: 19 U/L (ref 15–41)
Albumin: 2.4 g/dL — ABNORMAL LOW (ref 3.5–5.0)
Alkaline Phosphatase: 82 U/L (ref 38–126)
Anion gap: 11 (ref 5–15)
BUN: 14 mg/dL (ref 6–20)
CO2: 23 mmol/L (ref 22–32)
Calcium: 8.4 mg/dL — ABNORMAL LOW (ref 8.9–10.3)
Chloride: 105 mmol/L (ref 98–111)
Creatinine, Ser: 0.61 mg/dL (ref 0.44–1.00)
GFR calc Af Amer: >60 mL/min (ref >60)
GFR calc non Af Amer: >60 mL/min (ref >60)
Glucose, Bld: 187 mg/dL — ABNORMAL HIGH (ref 70–99)
Potassium: 4.4 mmol/L (ref 3.5–5.1)
Sodium: 139 mmol/L (ref 135–145)
Total Bilirubin: 0.6 mg/dL (ref 0.3–1.2)
Total Protein: 5.7 g/dL — ABNORMAL LOW (ref 6.5–8.1)

## 2020-01-13 LAB — BRAIN NATRIURETIC PEPTIDE: B Natriuretic Peptide: 41.3 pg/mL (ref 0.0–100.0)

## 2020-01-13 LAB — D-DIMER, QUANTITATIVE: D-Dimer, Quant: 3.36 ug/mL-FEU — ABNORMAL HIGH (ref 0.00–0.50)

## 2020-01-13 LAB — C-REACTIVE PROTEIN: CRP: 1 mg/dL — ABNORMAL HIGH (ref <1.0)

## 2020-01-13 MED ORDER — METHYLPREDNISOLONE SODIUM SUCC 40 MG IJ SOLR
40.0000 mg | Freq: Every day | INTRAMUSCULAR | Status: DC
  Administered 2020-01-13 – 2020-01-15 (×3): 40 mg via INTRAVENOUS
  Filled 2020-01-13 (×3): qty 1

## 2020-01-13 MED ORDER — FUROSEMIDE 10 MG/ML IJ SOLN
40.0000 mg | Freq: Once | INTRAMUSCULAR | Status: AC
  Administered 2020-01-13: 40 mg via INTRAVENOUS
  Filled 2020-01-13: qty 4

## 2020-01-13 MED ORDER — AZITHROMYCIN 500 MG PO TABS
500.0000 mg | ORAL_TABLET | Freq: Every day | ORAL | Status: AC
  Administered 2020-01-13: 500 mg via ORAL
  Filled 2020-01-13: qty 1

## 2020-01-13 NOTE — Progress Notes (Signed)
PROGRESS NOTE                                                                                                                                                                                                             Patient Demographics:    Kendra Collins, is a 54 y.o. female, DOB - 1966-06-26, ALP:[id number]  Outpatient Primary MD for the patient is System, Pcp Not In   Admit date - 01/09/2020   LOS - 4  No chief complaint on file.      Brief Narrative: Patient is a 54 y.o. female with PMHx of bronchial asthma-who was diagnosed with COVID-19 on 4/9 (per [facility] ED documentation)-transfer to [facility] from [place] for evaluation of acute hypoxemic respiratory failure secondary to COVID-19 pneumonia  Significant Events: 4/9>> Covid positive at local urgent care in [place] (per documentation from [facility]) 4/22>> transferred from [place] for hypoxia secondary to COVID-19/bacterial pneumonia  COVID-19 medications: Steroids: 4/22>> Remdesivir: 4/22>>  Antibiotics: Rocephin: 4/22>> Zithromax: 4/22>>  Microbiology data: Blood culture: 4/22>> ordered  DVT prophylaxis: SQ Lovenox  Procedures: None  Consults: None    Subjective:   Patient in bed, appears comfortable, denies any headache, no fever, no chest pain or pressure, improving shortness of breath , no abdominal pain. No focal weakness.   Assessment  & Plan :   Acute Hypoxic Resp Failure due to Covid 19 Viral pneumonia and concurrent bacterial pneumonia: She COVID-19 pneumonia along with most likely superimposed bacterial infection suggested by extremely elevated procalcitonin levels.  She has been appropriately started on high-dose IV steroids, remdesivir along with antibiotics for bacterial component of the infection.  She still has mild crackles for which I will repeat IV Lasix on 01/13/2020, clinically now showing gradual signs  of improvement.  Encouraged the patient to sit up in the chair in the daytime and prone in bed at night, use I-S and flutter valve for pulmonary toiletry 3-4 times every hour while awake.    Recent Labs  Lab 01/10/20 0539 01/11/20 0232 01/12/20 0808 01/13/20 0253  NA 139 141 141 139  K 3.8 3.9 3.9 4.4  CL 105 107 108 105  CO2 22 22 22 23   GLUCOSE 107* 154* 160* 187*  BUN 12 13 11 14   CREATININE 0.61 0.49 0.56 0.61  CALCIUM 8.4* 8.3* 8.5* 8.4*  AST 40 24 19 19   ALT 50* 37 30 28  ALKPHOS 94 84 86 82  BILITOT 0.6 0.3 0.6 0.6  ALBUMIN 2.3* 2.2* 2.4* 2.4*  MG 2.2  --   --   --   CRP 13.1* 4.2* 1.5* 1.0*  DDIMER 0.54* 0.42 3.90* 3.36*  PROCALCITON 36.89 21.50 6.91  --   BNP  --  42.7 72.8 41.3     Transaminitis: Mild due to viral infection, asymptomatic will trend.  Bronchial asthma: No exacerbation-continue bronchodilators and IV steroids.  Morbid Obesity: BMI of 36 follow with PCP for weight loss.   Rapidly rising D-dimer due to inflammation from COVID-19.  High risk for developing a blood clot. Stable on moderate dose Lovenox - continue.  Anxiety.  Added Klonopin, better.     Condition -  Guarded  Family Communication  :  Spouse updated over the [phone]  phone 01/11/19, 01/13/2020  Code Status :  Full Code  Diet :  Diet Order            Diet Heart Room service appropriate? Yes; Fluid consistency: Thin  Diet effective now               Disposition Plan  : Remain in the hospital, still has severe hypoxic respiratory failure requiring multiple liters of oxygen.  Possible discharge home once she she has recovered from severe hypoxic respiratory failure.    Antimicorbials  :    Anti-infectives (From admission, onward)   Start     Dose/Rate Route Frequency Ordered Stop   01/10/20 1000  remdesivir 100 mg in sodium chloride 0.9 % 100 mL IVPB     100 mg 200 mL/hr over 30 Minutes Intravenous Daily 01/09/20 2138 01/13/20 0911   01/09/20 2115  azithromycin  (ZITHROMAX) 500 mg in sodium chloride 0.9 % 250 mL IVPB     500 mg 250 mL/hr over 60 Minutes Intravenous Every 24 hours 01/09/20 2009 01/14/20 2159   01/09/20 2100  cefTRIAXone (ROCEPHIN) 2 g in sodium chloride 0.9 % 100 mL IVPB     2 g 200 mL/hr over 30 Minutes Intravenous Every 24 hours 01/09/20 2009 01/14/20 2059      Inpatient Medications  Scheduled Meds: . albuterol  2 puff Inhalation Q6H  . clonazepam  0.5 mg Oral BID  . enoxaparin (LOVENOX) injection  40 mg Subcutaneous Q12H  . mouth rinse  15 mL Mouth Rinse BID  . methylPREDNISolone (SOLU-MEDROL) injection  40 mg Intravenous Daily  . sodium chloride flush  3 mL Intravenous Q12H   Continuous Infusions: . azithromycin 500 mg (01/12/20 2219)  . cefTRIAXone (ROCEPHIN)  IV 2 g (01/12/20 2025)   PRN Meds:.acetaminophen, albuterol, alum & mag hydroxide-simeth, guaiFENesin-dextromethorphan, loperamide, [DISCONTINUED] ondansetron **OR** ondansetron (ZOFRAN) IV, phenol, polyvinyl alcohol, senna-docusate, sodium chloride   Time Spent in minutes  35  See all Orders from today for further details   01/14/20 M.D on 01/13/2020 at 10:08 AM  To page go to [email or web address] - use universal password  [facility] -  Office  [phone]    Objective:   Vitals:   01/13/20 0020 01/13/20 0309 01/13/20 0635 01/13/20 0755  BP:  121/67  (!) 143/90  Pulse: 79 64 71 84  Resp: 17 (!) 26 (!) 25 (!) 23  Temp:  98.1 F (36.7 C)    TempSrc:  Oral    SpO2: 91% 95% 94% 94%  Weight:      Height:  Wt Readings from Last 3 Encounters:  01/09/20 88.1 kg  09/29/16 84.8 kg  09/08/16 84.8 kg     Intake/Output Summary (Last 24 hours) at 01/13/2020 1008 Last data filed at 01/12/2020 1700 Gross per 24 hour  Intake 100 ml  Output 500 ml  Net -400 ml     Physical Exam  Awake Alert, No new F.N deficits, Normal affect [place].AT,PERRAL Supple Neck,No JVD, No cervical lymphadenopathy appriciated.  Symmetrical Chest wall movement,  Good air movement bilaterally, CTAB RRR,No Gallops, Rubs or new Murmurs, No Parasternal Heave +ve B.Sounds, Abd Soft, No tenderness, No organomegaly appriciated, No rebound - guarding or rigidity. No Cyanosis, Clubbing or edema, No new Rash or bruise     Data Review:    CBC Recent Labs  Lab 01/10/20 0539 01/11/20 0232 01/12/20 0808 01/13/20 0253  WBC 6.2 7.1 9.5 10.0  HGB 14.5 13.6 14.0 13.4  HCT 44.0 41.8 42.5 40.4  PLT 453* 465* 559* 521*  MCV 83.7 82.9 83.2 83.0  MCH 27.6 27.0 27.4 27.5  MCHC 33.0 32.5 32.9 33.2  RDW 13.4 13.4 13.2 13.2  LYMPHSABS 1.3 1.2 1.2 0.9  MONOABS 0.4 0.3 0.6 0.5  EOSABS 0.0 0.0 0.0 0.0  BASOSABS 0.1 0.0 0.0 0.0    Chemistries  Recent Labs  Lab 01/10/20 0539 01/11/20 0232 01/12/20 0808 01/13/20 0253  NA 139 141 141 139  K 3.8 3.9 3.9 4.4  CL 105 107 108 105  CO2 22 22 22 23   GLUCOSE 107* 154* 160* 187*  BUN 12 13 11 14   CREATININE 0.61 0.49 0.56 0.61  CALCIUM 8.4* 8.3* 8.5* 8.4*  MG 2.2  --   --   --   AST 40 24 19 19   ALT 50* 37 30 28  ALKPHOS 94 84 86 82  BILITOT 0.6 0.3 0.6 0.6   ------------------------------------------------------------------------------------------------------------------ No results for input(s): CHOL, HDL, LDLCALC, TRIG, CHOLHDL, LDLDIRECT in the last 72 hours.  No results found for: HGBA1C ------------------------------------------------------------------------------------------------------------------ No results for input(s): TSH, T4TOTAL, T3FREE, THYROIDAB in the last 72 hours.  Invalid input(s): FREET3 ------------------------------------------------------------------------------------------------------------------ Recent Labs    01/11/20 0232  FERRITIN 995*    Coagulation profile No results for input(s): INR, PROTIME in the last 168 hours.  Recent Labs    01/12/20 0808 01/13/20 0253  DDIMER 3.90* 3.36*    Cardiac Enzymes No results for input(s): CKMB, TROPONINI, MYOGLOBIN in the  last 168 hours.  Invalid input(s): CK ------------------------------------------------------------------------------------------------------------------    Component Value Date/Time   BNP 41.3 01/13/2020 0253    Micro Results Recent Results (from the past 240 hour(s))  MRSA PCR Screening     Status: None   Collection Time: 01/10/20  5:56 AM   Specimen: Nasal Mucosa; Nasopharyngeal  Result Value Ref Range Status   MRSA by PCR NEGATIVE NEGATIVE Final    Comment:        The GeneXpert MRSA Assay (FDA approved for NASAL specimens only), is one component of a comprehensive MRSA colonization surveillance program. It is not intended to diagnose MRSA infection nor to guide or monitor treatment for MRSA infections. Performed at [facility], [address] [place], [facility] [address]   Culture, blood (routine x 2)     Status: None (Preliminary result)   Collection Time: 01/10/20 11:35 AM   Specimen: BLOOD  Result Value Ref Range Status   Specimen Description BLOOD LEFT ANTECUBITAL  Final   Special Requests   Final    BOTTLES DRAWN AEROBIC AND ANAEROBIC  Blood Culture adequate volume   Culture   Final    NO GROWTH 3 DAYS Performed at [facility], [address] [place], [place] [address]    Report Status PENDING  Incomplete  Culture, blood (routine x 2)     Status: None (Preliminary result)   Collection Time: 01/10/20 11:40 AM   Specimen: BLOOD RIGHT HAND  Result Value Ref Range Status   Specimen Description BLOOD RIGHT HAND  Final   Special Requests   Final    BOTTLES DRAWN AEROBIC ONLY Blood Culture adequate volume   Culture   Final    NO GROWTH 3 DAYS Performed at [facility], [address] [place], [place] [address]    Report Status PENDING  Incomplete    Radiology Reports DG Chest Port 1 View  Result Date: 01/11/2020 CLINICAL DATA:  54 year old female with COVID pneumonia EXAM: PORTABLE CHEST 1 VIEW COMPARISON:  Prior chest x-ray 01/09/2020  FINDINGS: Improved inspiratory volumes. Significant interval improved vulvar in diffuse bilateral patchy interstitial and airspace opacities in a predominantly peripheral and basilar distribution. Stable cardiac and mediastinal contours. No acute osseous abnormality. No large effusion or pneumothorax. IMPRESSION: Improving multifocal viral pneumonia. Improved inspiratory volume. Electronically Signed   By: Malachy Moan M.D.   On: 01/11/2020 09:32

## 2020-01-13 NOTE — Progress Notes (Signed)
SATURATION QUALIFICATIONS: (This note is used to comply with regulatory documentation for home oxygen)  Patient Saturations on Room Air at Rest = 93%  Patient Saturations on Room Air while Ambulating = 86%  Patient Saturations on 2 Liters of oxygen while Ambulating = 93%  Please briefly explain why patient needs home oxygen: Pt with decreased endurance and desats with mobility. Oxygen needed to maximize safety and independence with ADLs/mobility.

## 2020-01-13 NOTE — Progress Notes (Signed)
   01/13/20 1100  Clinical Encounter Type  Visited With Patient  Visit Type Initial  Referral From Nurse  Consult/Referral To None  Recommendations  (patient could benefit from follow up chaplain support)  Spiritual Encounters  Spiritual Needs Prayer;Emotional;Grief support  Stress Factors  Patient Stress Factors Loss  Family Stress Factors Loss   Chaplain visited with patient over the phone per referral from RN. Patient shared about her illness and how many of her family members are sick right now and she lost both of her parents recently but could not attend their funerals due to COVID. Patient stated that she has a strong faith, but it is very hard to be away from her family right now. Chaplain provided empathic listening. Patient could benefit from further support as needed. Please page chaplain as needs arise.   Chaplain Sheppard Coil Pager: [phone]

## 2020-01-13 NOTE — Plan of Care (Signed)

## 2020-01-13 NOTE — TOC Initial Note (Addendum)
Transition of Care [facility]) - Initial/Assessment Note    Patient Details  Name: Kendra Collins MRN: [id number] Date of Birth: 17-Dec-1965  Transition of Care [facility]) CM/SW Contact:    Maryclare Labrador, RN Phone Number: 01/13/2020, 12:24 PM  Clinical Narrative:   PTA independent from home with spouse. Pt confirms that her husband will provide 24/7 supervision.  Pt confirms she has a PCP and denied barriers with paying for medications.  Pt informed CM that she does have insurance via [employer].  CM requested RN assistance with getting pts insurance card out of her personal belongings located in the room.  CM offered medicare.gov HH choice - no preference given.     Alvis Lemmings accepts pt for [facility] pending orders.  Pts insurance card forwarded to [facility] and bedside nurse to fax to admitting.            Expected Discharge Plan: [place] Barriers to Discharge: Continued Medical Work up   Patient Goals and CMS Choice Patient states their goals for this hospitalization and ongoing recovery are:: Pt states she is ready to get back home once she feels better CMS Medicare.gov Compare Post Acute Care list provided to:: Patient Choice offered to / list presented to : Patient  Expected Discharge Plan and Services Expected Discharge Plan: [place] Choice: [place] arrangements for the past 2 months: Single Family Home                           HH Arranged: PT, OT HH Agency: [place] Date [facility] Agency Contacted: 01/13/20 Time [place]: [id number] Representative spoke with at [place]: Alvis Lemmings  Prior Living Arrangements/Services Living arrangements for the past 2 months: [place] Lives with:: Spouse Patient language and need for interpreter reviewed:: Yes Do you feel safe going back to the place where you live?: Yes      Need for Family Participation in Patient Care: Yes (Comment) Care giver support system  in place?: Yes (comment)   Criminal Activity/Legal Involvement Pertinent to Current Situation/Hospitalization: No - Comment as needed  Activities of Daily Living Home Assistive Devices/Equipment: None ADL Screening (condition at time of admission) Patient's cognitive ability adequate to safely complete daily activities?: Yes Is the patient deaf or have difficulty hearing?: No Does the patient have difficulty seeing, even when wearing glasses/contacts?: No Does the patient have difficulty concentrating, remembering, or making decisions?: No Patient able to express need for assistance with ADLs?: Yes Does the patient have difficulty dressing or bathing?: No Independently performs ADLs?: Yes (appropriate for developmental age) Does the patient have difficulty walking or climbing stairs?: No Weakness of Legs: None Weakness of Arms/Hands: Right  Permission Sought/Granted   Permission granted to share information with : Yes, Verbal Permission Granted              Emotional Assessment   Attitude/Demeanor/Rapport: Self-Confident, Engaged, Charismatic, Gracious Affect (typically observed): Accepting, Adaptable Orientation: : Oriented to Self, Oriented to Place, Oriented to  Time, Oriented to Situation   Psych Involvement: No (comment)  Admission diagnosis:  Acute respiratory disease due to COVID-19 virus [U07.1, J06.9] Patient Active Problem List   Diagnosis Date Noted  . Acute respiratory disease due to COVID-19 virus 01/09/2020  . Acute on chronic respiratory failure with hypoxia ([place]) 01/09/2020  . Mild intermittent asthma 01/09/2020   PCP:  System, Pcp  Not In Pharmacy:   [facility], [place] - [address] [place] [address] Phone: [phone] Fax: [phone]     Social Determinants of Health (SDOH) Interventions    Readmission Risk Interventions No flowsheet data found.

## 2020-01-13 NOTE — Progress Notes (Signed)
Occupational Therapy Treatment Patient Details Name: Kendra Collins MRN: [id number] DOB: 28-Sep-1965 Today's Date: 01/13/2020    History of present illness Patient is a 54 y.o. female with PMHx of bronchial asthma-who was diagnosed with COVID-19 on 4/9 (per [facility] ED documentation)-transfer to [facility] from [facility] for evaluation of acute hypoxemic respiratory failure secondary to COVID-19 pneumonia   OT comments  Pt received in bed, on 2 L O2, and agreeable to work with therapy. Pt Independent for bed mobility, Supervision for sit to stand and short distance mobility to bathroom without AD. Pt distant supervision for toileting task and hand hygiene standing at sink. Assessed cardiopulmonary tolerance on RA for hallway distance mobility. Pt desats to 86% on RA, recovers to low 90s within 30 seconds. With 2 L O2 on, pt remains 93% or greater during activity. Pt demonstrates unsteadiness on feet without AD and with prolonged activity - min guard to correct one LOB and ensure safety. Reinforced use of flutter valve and incentive spirometer (up to 500 mL) with pt able to correctly return demonstrate. Pt left on 2 L O2, stats in 90s. Will continue to follow acutely.    Follow Up Recommendations  Home health OT;Supervision/Assistance - 24 hour    Equipment Recommendations  Tub/shower seat    Recommendations for Other Services      Precautions / Restrictions Precautions Precautions: Other (comment) Precaution Comments: monitor O2 sats Restrictions Weight Bearing Restrictions: No       Mobility Bed Mobility Overal bed mobility: Independent                Transfers Overall transfer level: Needs assistance Equipment used: None Transfers: Sit to/from Stand;Stand Pivot Transfers Sit to Stand: Supervision Stand pivot transfers: Min guard       General transfer comment: min guard to ensure safety     Balance Overall balance assessment: Needs  assistance Sitting-balance support: Feet supported;Bilateral upper extremity supported Sitting balance-Leahy Scale: Normal     Standing balance support: No upper extremity supported;During functional activity Standing balance-Leahy Scale: Fair Standing balance comment: min guard for dynamic balance and long distance mobility with one LOB noted                            ADL either performed or assessed with clinical judgement   ADL Overall ADL's : Needs assistance/impaired     Grooming: Supervision/safety;Standing;Wash/dry hands Grooming Details (indicate cue type and reason): Supervision for hand hygiene standing at sink without AD                 Toilet Transfer: Set up;Supervision/safety;Ambulation;Regular Teacher, adult education Details (indicate cue type and reason): Supervision without AD Toileting- Clothing Manipulation and Hygiene: Supervision/safety;Sit to/from stand;Sitting/lateral lean Toileting - Clothing Manipulation Details (indicate cue type and reason): Supervision, no physical assistance needed     Functional mobility during ADLs: Min guard(min guard for longer distances with unsteadiness noted ) General ADL Comments: Pt with decreased cardiopulmonary tolerance and unsteadiness on feet today     Vision       Perception     Praxis      Cognition Arousal/Alertness: Awake/alert Behavior During Therapy: WFL for tasks assessed/performed Overall Cognitive Status: Within Functional Limits for tasks assessed  Exercises Exercises: Other exercises Other Exercises Other Exercises: IS and Flutter valve x 10 each max inspired volume 300 mL   Shoulder Instructions       General Comments Pt received on 2 L O2, stats at low 90s at rest. Low 90s at rest on RA as well. During prolonged activity, desats to 86% on RA, and recovers within 30 seconds of standing rest break. 93% on 2 LO 2 during  activity     Pertinent Vitals/ Pain       Pain Assessment: No/denies pain  Home Living                                          Prior Functioning/Environment              Frequency  Min 2X/week        Progress Toward Goals  OT Goals(current goals can now be found in the care plan section)  Progress towards OT goals: Progressing toward goals  Acute Rehab OT Goals Patient Stated Goal: home to family OT Goal Formulation: With patient Time For Goal Achievement: 01/25/20 Potential to Achieve Goals: Good ADL Goals Pt Will Perform Grooming: with modified independence;sitting;standing Pt Will Perform Lower Body Bathing: with modified independence;sitting/lateral leans;sit to/from stand Pt Will Perform Upper Body Dressing: sitting;with modified independence Pt Will Perform Lower Body Dressing: with modified independence;sit to/from stand;sitting/lateral leans Pt Will Transfer to Toilet: with modified independence;ambulating Pt Will Perform Toileting - Clothing Manipulation and hygiene: with modified independence;sit to/from stand Additional ADL Goal #1: Pt will verbalize/demonstrate at least 3 energy conservation strategies during functional task.  Plan Discharge plan remains appropriate    Co-evaluation                 AM-PAC OT "6 Clicks" Daily Activity     Outcome Measure   Help from another person eating meals?: None Help from another person taking care of personal grooming?: A Little Help from another person toileting, which includes using toliet, bedpan, or urinal?: A Little Help from another person bathing (including washing, rinsing, drying)?: A Little Help from another person to put on and taking off regular upper body clothing?: A Little Help from another person to put on and taking off regular lower body clothing?: A Little 6 Click Score: 19    End of Session Equipment Utilized During Treatment: Oxygen;Gait belt  OT Visit Diagnosis:  Muscle weakness (generalized) (M62.81);Other (comment)   Activity Tolerance Patient tolerated treatment well;Patient limited by fatigue   Patient Left in bed;with call bell/phone within reach   Nurse Communication Mobility status        Time: 1136-1200 OT Time Calculation (min): 24 min  Charges: OT General Charges $OT Visit: 1 Visit OT Treatments $Self Care/Home Management : 8-22 mins $Therapeutic Activity: 8-22 mins  Layla Maw, OTR/L   Layla Maw 01/13/2020, 2:26 PM

## 2020-01-13 NOTE — Progress Notes (Addendum)
PHARMACIST - PHYSICIAN COMMUNICATION   CONCERNING: Antibiotic IV to Oral Route Change Policy   RECOMMENDATION: This patient is receiving azithromycin  by the intravenous route.  Based on criteria approved by the Pharmacy and Therapeutics Committee, the antibiotic(s) is/are being converted to the equivalent oral dose form(s).     DESCRIPTION:  These criteria include:  Patient being treated for a respiratory tract infection, urinary tract infection, cellulitis or clostridium difficile associated diarrhea if on metronidazole  The patient is not neutropenic and does not exhibit a GI malabsorption state  The patient is eating (either orally or via tube) and/or has been taking other orally administered medications for a least 24 hours  The patient is improving clinically and has a Tmax < 100.5   If you have questions about this conversion, please contact the Pharmacy Department  []  ( [phone] )  [place] []  ( [phone] )  [facility] [x]  ( [phone] )  [place] []  ( [phone] )  [facility] []  ( [phone] )  [facility]   

## 2020-01-14 ENCOUNTER — Inpatient Hospital Stay (HOSPITAL_COMMUNITY): Payer: No Typology Code available for payment source

## 2020-01-14 DIAGNOSIS — R7989 Other specified abnormal findings of blood chemistry: Secondary | ICD-10-CM

## 2020-01-14 DIAGNOSIS — R609 Edema, unspecified: Secondary | ICD-10-CM

## 2020-01-14 LAB — COMPREHENSIVE METABOLIC PANEL
ALT: 33 U/L (ref 0–44)
AST: 22 U/L (ref 15–41)
Albumin: 2.4 g/dL — ABNORMAL LOW (ref 3.5–5.0)
Alkaline Phosphatase: 70 U/L (ref 38–126)
Anion gap: 10 (ref 5–15)
BUN: 16 mg/dL (ref 6–20)
CO2: 24 mmol/L (ref 22–32)
Calcium: 8.3 mg/dL — ABNORMAL LOW (ref 8.9–10.3)
Chloride: 107 mmol/L (ref 98–111)
Creatinine, Ser: 0.59 mg/dL (ref 0.44–1.00)
GFR calc Af Amer: >60 mL/min (ref >60)
GFR calc non Af Amer: >60 mL/min (ref >60)
Glucose, Bld: 93 mg/dL (ref 70–99)
Potassium: 4.2 mmol/L (ref 3.5–5.1)
Sodium: 141 mmol/L (ref 135–145)
Total Bilirubin: 0.4 mg/dL (ref 0.3–1.2)
Total Protein: 5.6 g/dL — ABNORMAL LOW (ref 6.5–8.1)

## 2020-01-14 LAB — CBC WITH DIFFERENTIAL/PLATELET
Abs Immature Granulocytes: 0.54 10*3/uL — ABNORMAL HIGH (ref 0.00–0.07)
Basophils Absolute: 0.1 10*3/uL (ref 0.0–0.1)
Basophils Relative: 0 %
Eosinophils Absolute: 0 10*3/uL (ref 0.0–0.5)
Eosinophils Relative: 0 %
HCT: 41.6 % (ref 36.0–46.0)
Hemoglobin: 13.6 g/dL (ref 12.0–15.0)
Immature Granulocytes: 4 %
Lymphocytes Relative: 20 %
Lymphs Abs: 2.4 10*3/uL (ref 0.7–4.0)
MCH: 27 pg (ref 26.0–34.0)
MCHC: 32.7 g/dL (ref 30.0–36.0)
MCV: 82.7 fL (ref 80.0–100.0)
Monocytes Absolute: 1.1 10*3/uL — ABNORMAL HIGH (ref 0.1–1.0)
Monocytes Relative: 9 %
Neutro Abs: 8.3 10*3/uL — ABNORMAL HIGH (ref 1.7–7.7)
Neutrophils Relative %: 67 %
Platelets: 498 10*3/uL — ABNORMAL HIGH (ref 150–400)
RBC: 5.03 MIL/uL (ref 3.87–5.11)
RDW: 13.2 % (ref 11.5–15.5)
WBC: 12.3 10*3/uL — ABNORMAL HIGH (ref 4.0–10.5)
nRBC: 0.3 % — ABNORMAL HIGH (ref 0.0–0.2)

## 2020-01-14 LAB — C-REACTIVE PROTEIN: CRP: 0.7 mg/dL (ref <1.0)

## 2020-01-14 LAB — BRAIN NATRIURETIC PEPTIDE: B Natriuretic Peptide: 20.3 pg/mL (ref 0.0–100.0)

## 2020-01-14 LAB — MAGNESIUM: Magnesium: 2.1 mg/dL (ref 1.7–2.4)

## 2020-01-14 LAB — D-DIMER, QUANTITATIVE: D-Dimer, Quant: 3.16 ug/mL-FEU — ABNORMAL HIGH (ref 0.00–0.50)

## 2020-01-14 LAB — PROCALCITONIN: Procalcitonin: 1.85 ng/mL

## 2020-01-14 MED ORDER — AZITHROMYCIN 500 MG PO TABS
500.0000 mg | ORAL_TABLET | Freq: Once | ORAL | Status: AC
  Administered 2020-01-14: 500 mg via ORAL
  Filled 2020-01-14: qty 1

## 2020-01-14 MED ORDER — ENOXAPARIN SODIUM 60 MG/0.6ML ~~LOC~~ SOLN
50.0000 mg | Freq: Two times a day (BID) | SUBCUTANEOUS | Status: DC
  Administered 2020-01-14 – 2020-01-15 (×2): 50 mg via SUBCUTANEOUS
  Filled 2020-01-14 (×2): qty 0.6

## 2020-01-14 MED ORDER — FUROSEMIDE 10 MG/ML IJ SOLN
40.0000 mg | Freq: Once | INTRAMUSCULAR | Status: AC
  Administered 2020-01-14: 40 mg via INTRAVENOUS
  Filled 2020-01-14: qty 4

## 2020-01-14 NOTE — TOC Benefit Eligibility Note (Signed)
Transition of Care [facility]) Benefit Eligibility Note    Patient Details  Name: Kendra Collins MRN: [id number] Date of Birth: Jul 28, 1966   Medication/Dose: Eliquis  Covered?: Yes   Prescription Coverage Preferred Pharmacy: any retail pharmacy  Spoke with Person/Company/Phone Number:: [employer] [phone]  Co-Pay: $100 for 30 day retail  Prior Approval: No  Patient should be able to use coupon card for medications at retail pharmacy   Orson Aloe Phone Number: 01/14/2020, 2:56 PM

## 2020-01-14 NOTE — Progress Notes (Signed)
Physical Therapy Treatment Patient Details Name: Kendra Collins MRN: [id number] DOB: May 22, 1966 Today's Date: 01/14/2020    History of Present Illness 54 year old female with PMHx of bronchial asthma-who was diagnosed with COVID-19 on 4/9 (per [facility] ED documentation)-transfer to [facility] from [facility] for evaluation of acute hypoxemic respiratory failure secondary to COVID-19 pneumonia    PT Comments    Patient unsteady with ambulation in room with narrow BOS and intermittently holding onto environmental objects to steady self. Use of RW for 2nd gait trial in hallway with RW with improved gait quality. 2L [place] for ambulation with oxygen saturation 92% pre and post ambulation, HR 110 bpm during ambulation. Education on safety with oxygen tube mgmt with mobility upon discharge home. Nurse in room at end of session. Recommend home PT and use of RW, suppl oxygen for ambulation.    Follow Up Recommendations  Home health PT;Supervision/Assistance - 24 hour     Equipment Recommendations  Rolling walker with 5" wheels;Other (comment)(home O2 for mobility)       Precautions / Restrictions Precautions Precautions: Fall Precaution Comments: monitor O2 sats Restrictions Weight Bearing Restrictions: No    Mobility  Bed Mobility  General bed mobility comments: Patient sitting EOB upon PT arrival.  Transfers Overall transfer level: Needs assistance Equipment used: None;Rolling walker (2 wheeled) Transfers: Sit to/from [employer] Sit to Stand: Supervision;Modified independent (Device/Increase time) Stand pivot transfers: Supervision;Modified independent (Device/Increase time)       General transfer comment: modI for transfer to [facility] from EOB, supervision sit<>stand with RW  Ambulation/Gait Ambulation/Gait assistance: Min guard;Supervision Gait Distance (Feet): 30 Feet(100 with RW) Assistive device: None;Rolling walker (2 wheeled) Gait Pattern/deviations:  Narrow base of support;Step-through pattern Gait velocity: decreased   General Gait Details: Patient on 2L [place] for ambulation. Oxygen saturation 92% pre ambulation and post ambulation with RW 125ft with supervision. Gait trial in room without AD and patient mildly unsteady, reaching for environmental objects to steady self. Per OT yesterday, patient with near LOB and may benefit from RW.   Stairs Not assessed    Balance Overall balance assessment: Needs assistance Sitting-balance support: Feet supported Sitting balance-Leahy Scale: Good     Standing balance support: No upper extremity supported Standing balance-Leahy Scale: (Fair+) Standing balance comment: Patient reaching for environmental objects to steady self.     Cognition Arousal/Alertness: Awake/alert Behavior During Therapy: WFL for tasks assessed/performed Overall Cognitive Status: Within Functional Limits for tasks assessed        General Comments General comments (skin integrity, edema, etc.): Patient on room air at start and end of session. 2L for ambulation as patient known to desat on room air yesterday during OT session.      Pertinent Vitals/Pain Pain Assessment: No/denies pain(no complaints of pain, no signs/symptoms of pain)     PT Goals (current goals can now be found in the care plan section) Progress towards PT goals: Progressing toward goals    Frequency    Min 3X/week      PT Plan Equipment recommendations need to be updated       AM-PAC PT "6 Clicks" Mobility   Outcome Measure  Help needed turning from your back to your side while in a flat bed without using bedrails?: None Help needed moving from lying on your back to sitting on the side of a flat bed without using bedrails?: None Help needed moving to and from a bed to a chair (including a wheelchair)?: A Little Help  needed standing up from a chair using your arms (e.g., wheelchair or bedside chair)?: None Help needed to walk in  hospital room?: A Little Help needed climbing 3-5 steps with a railing? : A Little 6 Click Score: 21    End of Session Equipment Utilized During Treatment: Gait belt;Oxygen Activity Tolerance: Patient tolerated treatment well Patient left: in chair;with call bell/phone within reach Nurse Communication: Mobility status;Other (comment)(oxygen saturation response) PT Visit Diagnosis: Muscle weakness (generalized) (M62.81);Difficulty in walking, not elsewhere classified (R26.2);Unsteadiness on feet (R26.81)     Time: 9211-9417 PT Time Calculation (min) (ACUTE ONLY): 28 min  Charges:  $Gait Training: 23-37 mins                     Birdie Hopes, PT, DPT Acute Rehab [phone] office    Birdie Hopes 01/14/2020, 4:53 PM

## 2020-01-14 NOTE — TOC Progression Note (Addendum)
Transition of Care [facility]) - Progression Note    Patient Details  Name: Kendra Collins MRN: [id number] Date of Birth: March 05, 1966  Transition of Care [facility]) CM/SW Contact  Cherylann Parr, RN Phone Number: 01/14/2020, 1:29 PM  Clinical Narrative:   Pt in agreement with home oxygen as ordered.  Pt chose [employer] - agency accepts referral.   Update 1530: CM informed by both CMA and [employer] that insurance information provided for [employer] is expired.  CM spoke with ptPer bedside nurse pt is now stating she has [employer].  CM requested  pt to call her employer to get current medical insurance information required for [facility] and DME.  Expected Discharge Plan: Home w Home Health Services Barriers to Discharge: Continued Medical Work up  Expected Discharge Plan and Services Expected Discharge Plan: Home w Home Health Services     Post Acute Care Choice: Home Health Living arrangements for the past 2 months: Single Family Home                           HH Arranged: PT, OT [facility] Agency: [facility] Date [facility] Agency Contacted: 01/13/20 Time HH Agency Contacted: 1224 Representative spoke with at [facility] Agency: Frances Furbish   Social Determinants of Health (SDOH) Interventions    Readmission Risk Interventions No flowsheet data found.

## 2020-01-14 NOTE — Progress Notes (Signed)
Lower extremity venous has been completed.   Preliminary results in CV Proc.   Blanch Media 01/14/2020 2:34 PM

## 2020-01-14 NOTE — Progress Notes (Signed)
PROGRESS NOTE                                                                                                                                                                                                             Patient Demographics:    Kendra Collins, is a 54 y.o. female, DOB - [birth year], OBS:[id number]  Outpatient Primary MD for the patient is System, Pcp Not In   Admit date - 01/09/2020   LOS - 5  No chief complaint on file.      Brief Narrative: Patient is a 54 y.o. female with PMHx of bronchial asthma-who was diagnosed with COVID-19 on 4/9 (per [facility] ED documentation)-transfer to [facility] from [place] [facility] for evaluation of acute hypoxemic respiratory failure secondary to COVID-19 pneumonia  Significant Events: 4/9>> Covid positive at local urgent care in [place] (per documentation from [facility]) 4/22>> transferred from [facility] for hypoxia secondary to COVID-19/bacterial pneumonia  COVID-19 medications: Steroids: 4/22>> Remdesivir: 4/22>>  Antibiotics: Rocephin: 4/22>> Zithromax: 4/22>>  Microbiology data: Blood culture: 4/22>> ordered  DVT prophylaxis: SQ Lovenox  Procedures: None  Consults: None    Subjective:   Patient in bed, appears comfortable, denies any headache, no fever, no chest pain or pressure, no shortness of breath, + cough , no abdominal pain. No focal weakness.    Assessment  & Plan :   Acute Hypoxic Resp Failure due to Covid 19 Viral pneumonia and concurrent bacterial pneumonia: She COVID-19 pneumonia along with most likely superimposed bacterial infection suggested by extremely elevated procalcitonin levels.  She has been appropriately started on high-dose IV steroids, remdesivir along with antibiotics for bacterial component of the infection.  Cynically she is much improved and currently on 1 L nasal cannula oxygen down from 12 L, will give 1 more  day of azithromycin, continue tapering steroids, if stable likely discharge on 01/15/2020.  Encouraged the patient to sit up in the chair in the daytime and prone in bed at night, use I-S and flutter valve for pulmonary toiletry 3-4 times every hour while awake.    Recent Labs  Lab 01/10/20 0539 01/11/20 0232 01/12/20 0808 01/13/20 0253 01/14/20 0352  NA 139 141 141 139 141  K 3.8 3.9 3.9 4.4 4.2  CL 105 107 108 105 107  CO2 22 22 22 23 24   GLUCOSE 107*  154* 160* 187* 93  BUN 12 13 11 14 16   CREATININE 0.61 0.49 0.56 0.61 0.59  CALCIUM 8.4* 8.3* 8.5* 8.4* 8.3*  AST 40 24 19 19 22   ALT 50* 37 30 28 33  ALKPHOS 94 84 86 82 70  BILITOT 0.6 0.3 0.6 0.6 0.4  ALBUMIN 2.3* 2.2* 2.4* 2.4* 2.4*  MG 2.2  --   --   --  2.1  CRP 13.1* 4.2* 1.5* 1.0* 0.7  DDIMER 0.54* 0.42 3.90* 3.36* 3.16*  PROCALCITON 36.89 21.50 6.91  --  1.85  BNP  --  42.7 72.8 41.3 20.3     Transaminitis: Mild due to viral infection, asymptomatic will trend.  Bronchial asthma: No exacerbation-continue bronchodilators and IV steroids.  Morbid Obesity: BMI of 36 follow with PCP for weight loss.   Rapidly rising D-dimer due to inflammation from COVID-19.  High risk for developing a blood clot.  Have increased to moderate dose Lovenox from 40 twice daily to 50 twice daily, will discharge her on 2 weeks of Eliquis at prophylactic dose, case manager to provide her with coupon for 1 month supply.  Clinically does not have PE but will check lower extremity leg ultrasound on 01/14/2020.  Anxiety.  Added Klonopin, better.     Condition -  Guarded  Family Communication  :  Spouse updated over the [phone]  phone 01/11/19, 01/13/2020  Code Status :  Full Code  Diet :  Diet Order            Diet Heart Room service appropriate? Yes; Fluid consistency: Thin  Diet effective now               Disposition Plan  : Remain in the hospital, still has severe hypoxic respiratory failure requiring multiple liters of  oxygen.  Possible discharge home once she she has recovered from severe hypoxic respiratory failure.    Antimicorbials  :    Anti-infectives (From admission, onward)   Start     Dose/Rate Route Frequency Ordered Stop   01/13/20 2200  azithromycin (ZITHROMAX) tablet 500 mg     500 mg Oral Daily at bedtime 01/13/20 1555 01/13/20 2131   01/10/20 1000  remdesivir 100 mg in sodium chloride 0.9 % 100 mL IVPB     100 mg 200 mL/hr over 30 Minutes Intravenous Daily 01/09/20 2138 01/13/20 0941   01/09/20 2115  azithromycin (ZITHROMAX) 500 mg in sodium chloride 0.9 % 250 mL IVPB  Status:  Discontinued     500 mg 250 mL/hr over 60 Minutes Intravenous Every 24 hours 01/09/20 2009 01/13/20 1555   01/09/20 2100  cefTRIAXone (ROCEPHIN) 2 g in sodium chloride 0.9 % 100 mL IVPB     2 g 200 mL/hr over 30 Minutes Intravenous Every 24 hours 01/09/20 2009 01/13/20 2058      Inpatient Medications  Scheduled Meds: . albuterol  2 puff Inhalation Q6H  . clonazepam  0.5 mg Oral BID  . enoxaparin (LOVENOX) injection  50 mg Subcutaneous Q12H  . mouth rinse  15 mL Mouth Rinse BID  . methylPREDNISolone (SOLU-MEDROL) injection  40 mg Intravenous Daily  . sodium chloride flush  3 mL Intravenous Q12H   Continuous Infusions:  PRN Meds:.acetaminophen, albuterol, alum & mag hydroxide-simeth, guaiFENesin-dextromethorphan, loperamide, [DISCONTINUED] ondansetron **OR** ondansetron (ZOFRAN) IV, phenol, polyvinyl alcohol, senna-docusate, sodium chloride   Time Spent in minutes  35  See all Orders from today for further details   Lala Lund M.D on 01/14/2020 at 9:55 AM  To page go to [email or web address] - use universal password  [facility] -  Office  [phone]    Objective:   Vitals:   01/14/20 0124 01/14/20 0451 01/14/20 0757 01/14/20 0811  BP: 117/78 93/67 120/82 111/68  Pulse: 86 79 92 91  Resp: 20 12 19 17   Temp: 97.6 F (36.4 C) 97.7 F (36.5 C) 97.7 F (36.5 C) 98.2 F (36.8 C)    TempSrc: Oral Oral Oral Oral  SpO2: 94% 96% (!) 89% 94%  Weight:      Height:        Wt Readings from Last 3 Encounters:  01/09/20 88.1 kg  09/29/16 84.8 kg  09/08/16 84.8 kg     Intake/Output Summary (Last 24 hours) at 01/14/2020 0955 Last data filed at 01/13/2020 2058 Gross per 24 hour  Intake 730 ml  Output 251 ml  Net 479 ml     Physical Exam  Awake Alert, No new F.N deficits, Normal affect [place].AT,PERRAL Supple Neck,No JVD, No cervical lymphadenopathy appriciated.  Symmetrical Chest wall movement, Good air movement bilaterally, fine rales RRR,No Gallops, Rubs or new Murmurs, No Parasternal Heave +ve B.Sounds, Abd Soft, No tenderness, No organomegaly appriciated, No rebound - guarding or rigidity. No Cyanosis, Clubbing or edema, No new Rash or bruise    Data Review:    CBC Recent Labs  Lab 01/10/20 0539 01/11/20 0232 01/12/20 0808 01/13/20 0253 01/14/20 0352  WBC 6.2 7.1 9.5 10.0 12.3*  HGB 14.5 13.6 14.0 13.4 13.6  HCT 44.0 41.8 42.5 40.4 41.6  PLT 453* 465* 559* 521* 498*  MCV 83.7 82.9 83.2 83.0 82.7  MCH 27.6 27.0 27.4 27.5 27.0  MCHC 33.0 32.5 32.9 33.2 32.7  RDW 13.4 13.4 13.2 13.2 13.2  LYMPHSABS 1.3 1.2 1.2 0.9 2.4  MONOABS 0.4 0.3 0.6 0.5 1.1*  EOSABS 0.0 0.0 0.0 0.0 0.0  BASOSABS 0.1 0.0 0.0 0.0 0.1    Chemistries  Recent Labs  Lab 01/10/20 0539 01/11/20 0232 01/12/20 0808 01/13/20 0253 01/14/20 0352  NA 139 141 141 139 141  K 3.8 3.9 3.9 4.4 4.2  CL 105 107 108 105 107  CO2 22 22 22 23 24   GLUCOSE 107* 154* 160* 187* 93  BUN 12 13 11 14 16   CREATININE 0.61 0.49 0.56 0.61 0.59  CALCIUM 8.4* 8.3* 8.5* 8.4* 8.3*  MG 2.2  --   --   --  2.1  AST 40 24 19 19 22   ALT 50* 37 30 28 33  ALKPHOS 94 84 86 82 70  BILITOT 0.6 0.3 0.6 0.6 0.4   ------------------------------------------------------------------------------------------------------------------ No results for input(s): CHOL, HDL, LDLCALC, TRIG, CHOLHDL, LDLDIRECT in the last  72 hours.  No results found for: HGBA1C ------------------------------------------------------------------------------------------------------------------ No results for input(s): TSH, T4TOTAL, T3FREE, THYROIDAB in the last 72 hours.  Invalid input(s): FREET3 ------------------------------------------------------------------------------------------------------------------ No results for input(s): VITAMINB12, FOLATE, FERRITIN, TIBC, IRON, RETICCTPCT in the last 72 hours.  Coagulation profile No results for input(s): INR, PROTIME in the last 168 hours.  Recent Labs    01/13/20 0253 01/14/20 0352  DDIMER 3.36* 3.16*    Cardiac Enzymes No results for input(s): CKMB, TROPONINI, MYOGLOBIN in the last 168 hours.  Invalid input(s): CK ------------------------------------------------------------------------------------------------------------------    Component Value Date/Time   BNP 20.3 01/14/2020 0352    Micro Results Recent Results (from the past 240 hour(s))  MRSA PCR Screening     Status: None   Collection Time: 01/10/20  5:56 AM   Specimen: Nasal Mucosa; Nasopharyngeal  Result Value Ref Range Status   MRSA by PCR NEGATIVE NEGATIVE Final    Comment:        The GeneXpert MRSA Assay (FDA approved for NASAL specimens only), is one component of a comprehensive MRSA colonization surveillance program. It is not intended to diagnose MRSA infection nor to guide or monitor treatment for MRSA infections. Performed at [facility], [address] [place], [place] [address]   Culture, blood (routine x 2)     Status: None (Preliminary result)   Collection Time: 01/10/20 11:35 AM   Specimen: BLOOD  Result Value Ref Range Status   Specimen Description BLOOD LEFT ANTECUBITAL  Final   Special Requests   Final    BOTTLES DRAWN AEROBIC AND ANAEROBIC Blood Culture adequate volume   Culture   Final    NO GROWTH 4 DAYS Performed at [facility], [address],  [place], [place] [address]    Report Status PENDING  Incomplete  Culture, blood (routine x 2)     Status: None (Preliminary result)   Collection Time: 01/10/20 11:40 AM   Specimen: BLOOD RIGHT HAND  Result Value Ref Range Status   Specimen Description BLOOD RIGHT HAND  Final   Special Requests   Final    BOTTLES DRAWN AEROBIC ONLY Blood Culture adequate volume   Culture   Final    NO GROWTH 4 DAYS Performed at [facility], [address] [place], [place] [address]    Report Status PENDING  Incomplete    Radiology Reports DG Chest Port 1 View  Result Date: 01/11/2020 CLINICAL DATA:  54 year old female with COVID pneumonia EXAM: PORTABLE CHEST 1 VIEW COMPARISON:  Prior chest x-ray 01/09/2020 FINDINGS: Improved inspiratory volumes. Significant interval improved vulvar in diffuse bilateral patchy interstitial and airspace opacities in a predominantly peripheral and basilar distribution. Stable cardiac and mediastinal contours. No acute osseous abnormality. No large effusion or pneumothorax. IMPRESSION: Improving multifocal viral pneumonia. Improved inspiratory volume. Electronically Signed   By: Malachy Moan M.D.   On: 01/11/2020 09:32

## 2020-01-15 LAB — CULTURE, BLOOD (ROUTINE X 2)
Culture: NO GROWTH
Culture: NO GROWTH
Special Requests: ADEQUATE
Special Requests: ADEQUATE

## 2020-01-15 LAB — D-DIMER, QUANTITATIVE: D-Dimer, Quant: 2.16 ug/mL-FEU — ABNORMAL HIGH (ref 0.00–0.50)

## 2020-01-15 LAB — GLUCOSE, CAPILLARY: Glucose-Capillary: 84 mg/dL (ref 70–99)

## 2020-01-15 MED ORDER — APIXABAN 5 MG PO TABS: 5.0000 mg | ORAL_TABLET | Freq: Two times a day (BID) | ORAL | Status: DC

## 2020-01-15 MED ORDER — APIXABAN 2.5 MG PO TABS
2.5000 mg | ORAL_TABLET | Freq: Two times a day (BID) | ORAL | 0 refills | Status: DC
Start: 2020-01-15 — End: 2020-01-15

## 2020-01-15 MED ORDER — APIXABAN 5 MG PO TABS: 5.0000 mg | ORAL_TABLET | Freq: Two times a day (BID) | ORAL | 0 refills | Status: DC

## 2020-01-15 MED ORDER — APIXABAN 5 MG PO TABS: 10.0000 mg | ORAL_TABLET | Freq: Two times a day (BID) | ORAL | 0 refills | Status: DC

## 2020-01-15 MED ORDER — APIXABAN 2.5 MG PO TABS: 2.5000 mg | ORAL_TABLET | Freq: Two times a day (BID) | ORAL | 0 refills | Status: DC

## 2020-01-15 MED ORDER — APIXABAN 5 MG PO TABS: 10.0000 mg | ORAL_TABLET | Freq: Two times a day (BID) | ORAL | Status: DC

## 2020-01-15 MED ORDER — METHYLPREDNISOLONE 4 MG PO TBPK
ORAL_TABLET | ORAL | 0 refills | Status: DC
Start: 2020-01-15 — End: 2020-02-05

## 2020-01-15 MED ORDER — ENOXAPARIN SODIUM 60 MG/0.6ML ~~LOC~~ SOLN: 50.0000 mg | SUBCUTANEOUS | Status: DC

## 2020-01-15 MED FILL — ELIQUIS 2.5 MG TABLET: 2.5 | 14 days supply | Qty: 28 | Fill #0

## 2020-01-15 MED FILL — METHYLPREDNISOLONE 4 MG DOS: 4 | 6 days supply | Qty: 21 | Fill #0

## 2020-01-15 NOTE — Progress Notes (Signed)
eliquis 30 day coupon provided to patient with AVS.

## 2020-01-15 NOTE — Progress Notes (Addendum)
Occupational Therapy Treatment Patient Details Name: Kendra Collins MRN: [id number] DOB: 1965/10/02 Today's Date: 01/15/2020    History of present illness 54 year old female with PMHx of bronchial asthma-who was diagnosed with COVID-19 on 4/9 (per [facility] ED documentation)-transfer to [facility] from [place] for evaluation of acute hypoxemic respiratory failure secondary to COVID-19 pneumonia   OT comments  Pt progressing with OT goals and received on RA today. Pt completed various ADL tasks this AM including dressing, toileting, grooming standing at sink with no more than Supervision required to ensure safety. Pt's unsteadiness on feet has improved, but encouraged pt to use RW at home if feeling weaker. Pt completed hallway distance mobility without AD at min guard, desats to only 93% on RA, but does still exhibit some SOB. Encouraged rest breaks and continuation of other energy conservation strategies when discharged home.    Follow Up Recommendations  Home health OT;Supervision/Assistance - 24 hour    Equipment Recommendations  Other (comment)(RW)    Recommendations for Other Services      Precautions / Restrictions Precautions Precautions: Fall Precaution Comments: monitor O2 sats       Mobility Bed Mobility                  Transfers Overall transfer level: Needs assistance Equipment used: None Transfers: Sit to/from Stand;Stand Pivot Transfers Sit to Stand: Independent Stand pivot transfers: Supervision;Modified independent (Device/Increase time)       General transfer comment: Supervision to Modified Independent for transfers     Balance                                           ADL either performed or assessed with clinical judgement   ADL Overall ADL's : Needs assistance/impaired     Grooming: Supervision/safety;Set up;Standing;Oral care;Applying deodorant Grooming Details (indicate cue type and reason): Supervision for  safety in standing for grooming tasks at sink without AD Upper Body Bathing: Modified independent;Sitting       Upper Body Dressing : Independent;Sitting   Lower Body Dressing: Supervision/safety;Sitting/lateral leans;Sit to/from stand Lower Body Dressing Details (indicate cue type and reason): Pt distant supervision for LB dressing to don underwear, pants  Toilet Transfer: Supervision/safety;Ambulation;Regular Toilet   Toileting- Clothing Manipulation and Hygiene: Modified independent;Sitting/lateral lean;Sit to/from stand       Functional mobility during ADLs: Min guard(min guard for longer distance mobility ) General ADL Comments: Pt with improving steadiness and WFL O2 stats on RA. Still continues to exhibit SOB with prolonged activity      Vision       Perception     Praxis      Cognition Arousal/Alertness: Awake/alert Behavior During Therapy: WFL for tasks assessed/performed Overall Cognitive Status: Within Functional Limits for tasks assessed                                          Exercises     Shoulder Instructions       General Comments Pt on RA, 95% at rest, 93% at lowest for hallway mobility, decreased to 91% after ADLs, but recovered quickly with seated rest break. Encouraged continued rest breaks when discharged home     Pertinent Vitals/ Pain       Pain Assessment: No/denies pain  Home Living                                          Prior Functioning/Environment              Frequency  Min 2X/week        Progress Toward Goals  OT Goals(current goals can now be found in the care plan section)  Progress towards OT goals: Progressing toward goals  Acute Rehab OT Goals Patient Stated Goal: home to family OT Goal Formulation: With patient Time For Goal Achievement: 01/25/20 Potential to Achieve Goals: Good ADL Goals Pt Will Perform Grooming: with modified independence;sitting;standing Pt Will  Perform Lower Body Bathing: with modified independence;sitting/lateral leans;sit to/from stand Pt Will Perform Upper Body Dressing: sitting;with modified independence Pt Will Perform Lower Body Dressing: with modified independence;sit to/from stand;sitting/lateral leans Pt Will Transfer to Toilet: with modified independence;ambulating Pt Will Perform Toileting - Clothing Manipulation and hygiene: with modified independence;sit to/from stand Additional ADL Goal #1: Pt will verbalize/demonstrate at least 3 energy conservation strategies during functional task.  Plan Discharge plan remains appropriate    Co-evaluation                 AM-PAC OT "6 Clicks" Daily Activity     Outcome Measure   Help from another person eating meals?: None Help from another person taking care of personal grooming?: A Little Help from another person toileting, which includes using toliet, bedpan, or urinal?: A Little Help from another person bathing (including washing, rinsing, drying)?: A Little Help from another person to put on and taking off regular upper body clothing?: None Help from another person to put on and taking off regular lower body clothing?: A Little 6 Click Score: 20    End of Session Equipment Utilized During Treatment: Gait belt  OT Visit Diagnosis: Muscle weakness (generalized) (M62.81);Other (comment)   Activity Tolerance Patient tolerated treatment well   Patient Left in bed;with call bell/phone within reach   Nurse Communication Mobility status        Time: 0822-0840 OT Time Calculation (min): 18 min  Charges: OT General Charges $OT Visit: 1 Visit OT Treatments $Self Care/Home Management : 8-22 mins  Lorre Munroe, OTR/L   Lorre Munroe 01/15/2020, 2:21 PM

## 2020-01-15 NOTE — Progress Notes (Signed)
Nsg Discharge Note  Admit Date:  01/09/2020 Discharge date: 01/15/2020   AALYSSA ELDERKIN to be D/C'd Home per MD order.  AVS completed.     Discharge Medication: Allergies as of 01/15/2020      Reactions   Aspirin Other (See Comments)   Ask   Penicillin G Other (See Comments)   Unknown      Medication List    STOP taking these medications   Naproxen-Esomeprazole 375-20 MG Tbec Commonly known as: Vimovo   traMADol-acetaminophen 37.5-325 MG tablet Commonly known as: ULTRACET     TAKE these medications   albuterol 108 (90 Base) MCG/ACT inhaler Commonly known as: VENTOLIN HFA Inhale 2 puffs into the lungs every 4 (four) hours as needed for shortness of breath or wheezing.   apixaban 2.5 MG Tabs tablet Commonly known as: ELIQUIS Take 1 tablet (2.5 mg total) by mouth 2 (two) times daily.   methylPREDNISolone 4 MG Tbpk tablet Commonly known as: MEDROL DOSEPAK follow package directions   omeprazole 20 MG tablet Commonly known as: PRILOSEC OTC Take 20 mg by mouth daily as needed (acid reflux).       Discharge Assessment: Vitals:   01/15/20 0342 01/15/20 0736  BP: 91/60 128/80  Pulse: 80 84  Resp: 16 19  Temp: 98.5 F (36.9 C) 97.7 F (36.5 C)  SpO2: 93% 95%   Skin clean, dry and intact without evidence of skin break down, no evidence of skin tears noted. IV catheter discontinued intact. Site without signs and symptoms of complications - no redness or edema noted at insertion site, patient denies c/o pain - only slight tenderness at site.  Dressing with slight pressure applied.  D/c Instructions-Education: Discharge instructions given to patient/family with verbalized understanding. D/c education completed with patient/family including follow up instructions, medication list, d/c activities limitations if indicated, with other d/c instructions as indicated by MD - patient able to verbalize understanding, all questions fully answered. Patient instructed to return to ED,  call 911, or call MD for any changes in condition.  Patient escorted via WC, and D/C home via private auto.  Lyndal Pulley, RN 01/15/2020 10:57 AM

## 2020-01-15 NOTE — Progress Notes (Signed)
PT Cancellation Note  Patient Details Name: Kendra Collins MRN: [id number] DOB: September 29, 1965   Cancelled Treatment:    Reason Eval/Treat Not Completed: Patient declined, no reason specified  Patient declined having any concerns about her mobility for discharge home today. She declined practicing steps to enter home prior to discharge. Patient with questions about discharge medication. Nurse informed.   Angelene Giovanni 01/15/2020, 10:34 AM

## 2020-01-15 NOTE — Progress Notes (Signed)
                                      [facility]                            [address]                            [place], [place] [address]      Bora Bost was admitted to the Hospital on 01/09/2020 and Discharged  01/15/2020 and should be excused from work/school   for 21  days starting from date -  01/09/2020 , may return to work/school without any restrictions.  Call Susa Raring MD, [facility]  [phone] with questions.  Susa Raring M.D on 01/15/2020,at 10:20 AM  [facility]   Office  [phone]

## 2020-01-15 NOTE — Discharge Instructions (Signed)
Follow with Primary MD  in 7 days   Get CBC, CMP, 2 view Chest X ray -  checked next visit within 1 week by Primary MD Activity: As tolerated with Full fall precautions use walker/cane & assistance as needed  Disposition Home   Diet: Heart Healthy  Special Instructions: If you have smoked or chewed Tobacco  in the last 2 yrs please stop smoking, stop any regular Alcohol  and or any Recreational drug use.  On your next visit with your primary care physician please Get Medicines reviewed and adjusted.  Please request your Prim.MD to go over all Hospital Tests and Procedure/Radiological results at the follow up, please get all Hospital records sent to your Prim MD by signing hospital release before you go home.  If you experience worsening of your admission symptoms, develop shortness of breath, life threatening emergency, suicidal or homicidal thoughts you must seek medical attention immediately by calling 911 or calling your MD immediately  if symptoms less severe.  You Must read complete instructions/literature along with all the possible adverse reactions/side effects for all the Medicines you take and that have been prescribed to you. Take any new Medicines after you have completely understood and accpet all the possible adverse reactions/side effects.      Person Under Monitoring Name: Kendra Collins  Location: [address] [place] [address]   Infection Prevention Recommendations for Individuals Confirmed to have, or Being Evaluated for, 2019 Novel Coronavirus (COVID-19) Infection Who Receive Care at Home  Individuals who are confirmed to have, or are being evaluated for, COVID-19 should follow the prevention steps below until a healthcare provider or local or state health department says they can return to normal activities.  Stay home except to get medical care You should restrict activities outside your home, except for getting medical care. Do not go to work, school,  or public areas, and do not use public transportation or taxis.  Call ahead before visiting your doctor Before your medical appointment, call the healthcare provider and tell them that you have, or are being evaluated for, COVID-19 infection. This will help the healthcare provider's office take steps to keep other people from getting infected. Ask your healthcare provider to call the local or state health department.  Monitor your symptoms Seek prompt medical attention if your illness is worsening (e.g., difficulty breathing). Before going to your medical appointment, call the healthcare provider and tell them that you have, or are being evaluated for, COVID-19 infection. Ask your healthcare provider to call the local or state health department.  Wear a facemask You should wear a facemask that covers your nose and mouth when you are in the same room with other people and when you visit a healthcare provider. People who live with or visit you should also wear a facemask while they are in the same room with you.  Separate yourself from other people in your home As much as possible, you should stay in a different room from other people in your home. Also, you should use a separate bathroom, if available.  Avoid sharing household items You should not share dishes, drinking glasses, cups, eating utensils, towels, bedding, or other items with other people in your home. After using these items, you should wash them thoroughly with soap and water.  Cover your coughs and sneezes Cover your mouth and nose with a tissue when you cough or sneeze, or you can cough or sneeze into your sleeve. Throw used tissues in  a lined trash can, and immediately wash your hands with soap and water for at least 20 seconds or use an alcohol-based hand rub.  Wash your [employer] your hands often and thoroughly with soap and water for at least 20 seconds. You can use an alcohol-based hand sanitizer if soap and  water are not available and if your hands are not visibly dirty. Avoid touching your eyes, nose, and mouth with unwashed hands.   Prevention Steps for Caregivers and Household Members of Individuals Confirmed to have, or Being Evaluated for, COVID-19 Infection Being Cared for in the Home  If you live with, or provide care at home for, a person confirmed to have, or being evaluated for, COVID-19 infection please follow these guidelines to prevent infection:  Follow healthcare provider's instructions Make sure that you understand and can help the patient follow any healthcare provider instructions for all care.  Provide for the patient's basic needs You should help the patient with basic needs in the home and provide support for getting groceries, prescriptions, and other personal needs.  Monitor the patient's symptoms If they are getting sicker, call his or her medical provider and tell them that the patient has, or is being evaluated for, COVID-19 infection. This will help the healthcare provider's office take steps to keep other people from getting infected. Ask the healthcare provider to call the local or state health department.  Limit the number of people who have contact with the patient  If possible, have only one caregiver for the patient.  Other household members should stay in another home or place of residence. If this is not possible, they should stay  in another room, or be separated from the patient as much as possible. Use a separate bathroom, if available.  Restrict visitors who do not have an essential need to be in the home.  Keep older adults, very young children, and other sick people away from the patient Keep older adults, very young children, and those who have compromised immune systems or chronic health conditions away from the patient. This includes people with chronic heart, lung, or kidney conditions, diabetes, and cancer.  Ensure good ventilation Make  sure that shared spaces in the home have good air flow, such as from an air conditioner or an opened window, weather permitting.  Wash your hands often  Wash your hands often and thoroughly with soap and water for at least 20 seconds. You can use an alcohol based hand sanitizer if soap and water are not available and if your hands are not visibly dirty.  Avoid touching your eyes, nose, and mouth with unwashed hands.  Use disposable paper towels to dry your hands. If not available, use dedicated cloth towels and replace them when they become wet.  Wear a facemask and gloves  Wear a disposable facemask at all times in the room and gloves when you touch or have contact with the patient's blood, body fluids, and/or secretions or excretions, such as sweat, saliva, sputum, nasal mucus, vomit, urine, or feces.  Ensure the mask fits over your nose and mouth tightly, and do not touch it during use.  Throw out disposable facemasks and gloves after using them. Do not reuse.  Wash your hands immediately after removing your facemask and gloves.  If your personal clothing becomes contaminated, carefully remove clothing and launder. Wash your hands after handling contaminated clothing.  Place all used disposable facemasks, gloves, and other waste in a lined container before disposing them with  other household waste.  Remove gloves and wash your hands immediately after handling these items.  Do not share dishes, glasses, or other household items with the patient  Avoid sharing household items. You should not share dishes, drinking glasses, cups, eating utensils, towels, bedding, or other items with a patient who is confirmed to have, or being evaluated for, COVID-19 infection.  After the person uses these items, you should wash them thoroughly with soap and water.  Wash laundry thoroughly  Immediately remove and wash clothes or bedding that have blood, body fluids, and/or secretions or excretions,  such as sweat, saliva, sputum, nasal mucus, vomit, urine, or feces, on them.  Wear gloves when handling laundry from the patient.  Read and follow directions on labels of laundry or clothing items and detergent. In general, wash and dry with the warmest temperatures recommended on the label.  Clean all areas the individual has used often  Clean all touchable surfaces, such as counters, tabletops, doorknobs, bathroom fixtures, toilets, phones, keyboards, tablets, and bedside tables, every day. Also, clean any surfaces that may have blood, body fluids, and/or secretions or excretions on them.  Wear gloves when cleaning surfaces the patient has come in contact with.  Use a diluted bleach solution (e.g., dilute bleach with 1 part bleach and 10 parts water) or a household disinfectant with a label that says EPA-registered for coronaviruses. To make a bleach solution at home, add 1 tablespoon of bleach to 1 quart (4 cups) of water. For a larger supply, add  cup of bleach to 1 gallon (16 cups) of water.  Read labels of cleaning products and follow recommendations provided on product labels. Labels contain instructions for safe and effective use of the cleaning product including precautions you should take when applying the product, such as wearing gloves or eye protection and making sure you have good ventilation during use of the product.  Remove gloves and wash hands immediately after cleaning.  Monitor yourself for signs and symptoms of illness Caregivers and household members are considered close contacts, should monitor their health, and will be asked to limit movement outside of the home to the extent possible. Follow the monitoring steps for close contacts listed on the symptom monitoring form.   ? If you have additional questions, contact your local health department or call the epidemiologist on call at [phone] (available 24/7). ? This guidance is subject to change. For the most  up-to-date guidance from [facility], please refer to their website: [employer]hu  ===========================================================================================================  Information on my medicine - ELIQUIS (apixaban)   Why was Eliquis prescribed for you? Eliquis was prescribed for you to reduce the risk of blood clots forming after COVID-19 infection.  What do You need to know about Eliquis? Take your Eliquis TWICE DAILY - one tablet in the morning and one tablet in the evening with or without food.  It would be best to take the dose about the same time each day.  If you have difficulty swallowing the tablet whole please discuss with your pharmacist how to take the medication safely.  Take Eliquis exactly as prescribed by your doctor and DO NOT stop taking Eliquis without talking to the doctor who prescribed the medication.  Stopping without other medication to take the place of Eliquis may increase your risk of developing a clot.  After discharge, you should have regular check-up appointments with your healthcare provider that is prescribing your Eliquis.  What do you do if you miss a dose? If a dose  of ELIQUIS is not taken at the scheduled time, take it as soon as possible on the same day and twice-daily administration should be resumed.  The dose should not be doubled to make up for a missed dose.  Do not take more than one tablet of ELIQUIS at the same time.  Important Safety Information A possible side effect of Eliquis is bleeding. You should call your healthcare provider right away if you experience any of the following: ? Bleeding from an injury or your nose that does not stop. ? Unusual colored urine (red or dark brown) or unusual colored stools (red or black). ? Unusual bruising for unknown reasons. ? A serious fall or if you hit your head (even if there is no bleeding).  Some medicines may  interact with Eliquis and might increase your risk of bleeding or clotting while on Eliquis. To help avoid this, consult your healthcare provider or pharmacist prior to using any new prescription or non-prescription medications, including herbals, vitamins, non-steroidal anti-inflammatory drugs (NSAIDs) and supplements.  This website has more information on Eliquis (apixaban): http://www.eliquis.com/eliquis/home

## 2020-01-15 NOTE — Progress Notes (Addendum)
SATURATION QUALIFICATIONS: (This note is used to comply with regulatory documentation for home oxygen)  Patient Saturations on Room Air at Rest = 95%  Patient Saturations on Room Air while Ambulating = 93%  Patient Saturations on N/A Liters of oxygen while Ambulating = N/A  Please briefly explain why patient needs home oxygen: N/A - Pt endorses mild SOB when ambulating, but SpO2 stats remained within functional limits.

## 2020-01-15 NOTE — Discharge Summary (Addendum)
Kendra Collins ZOX:[id number] DOB: 07-29-1966 DOA: 01/09/2020  PCP: System, Pcp Not In  Admit date: 01/09/2020  Discharge date: 01/15/2020  Admitted From: Home   Disposition:  Home   Recommendations for Outpatient Follow-up:   Follow up with PCP in 1-2 weeks  PCP Please obtain BMP/CBC, 2 view CXR in 1week,  (see Discharge instructions)   PCP Please follow up on the following pending results: will need Eliquis refills after 1 month, she is getting 1 month supply here.   Home Health: None   Equipment/Devices: None  Consultations: None  Discharge Condition: Stable    CODE STATUS: Full    Diet Recommendation: Heart Healthy   Diet Order            Diet - low sodium heart healthy        Diet Heart Room service appropriate? Yes; Fluid consistency: Thin  Diet effective now               CC - SOB   Brief history of present illness from the day of admission and additional interim summary    Patient is a 54 y.o. female with PMHx of bronchial asthma-who was diagnosed with COVID-19 on 4/9 (per [facility] ED documentation)-transfer to [facility] from [place] for evaluation of acute hypoxemic respiratory failure secondary to COVID-19 pneumonia  Significant Events:  4/9>> Covid positive at local urgent care in [place] (per documentation from [facility]) 4/22>> transferred from [place] for hypoxia secondary to COVID-19/bacterial pneumonia                                                                 Hospital Course    Acute Hypoxic Resp Failure due to Covid 19 Viral pneumonia and concurrent bacterial pneumonia: She COVID-19 pneumonia along with most likely superimposed bacterial infection suggested by extremely elevated procalcitonin levels.  She has been appropriately started on high-dose  IV steroids, remdesivir along with antibiotics for bacterial component of the infection.  Cynically she is much improved and now symptom-free on room air, ambulated her in the hallway without any problems and no oxygen need today, she has completed her course of antibiotics, will be discharged home on a Medrol Dosepak along with a rescue inhaler which she already has, follow with PCP in a week..   Recent Labs  Lab 01/10/20 0539 01/10/20 0539 01/11/20 0232 01/12/20 0808 01/13/20 0253 01/14/20 0352 01/15/20 0519  CRP 13.1*  --  4.2* 1.5* 1.0* 0.7  --   DDIMER 0.54*   < > 0.42 3.90* 3.36* 3.16* 2.16*  FERRITIN 1,351*  --  995*  --   --   --   --   BNP  --   --  42.7 72.8 41.3 20.3  --   PROCALCITON 36.89  --  21.50 6.91  --  1.85  --    < > = values in this interval not displayed.    Hepatic Function Latest Ref Rng & Units 01/14/2020 01/13/2020 01/12/2020  Total Protein 6.5 - 8.1 g/dL 5.6(L) 5.7(L) 6.0(L)  Albumin 3.5 - 5.0 g/dL 2.4(L) 2.4(L) 2.4(L)  AST 15 - 41 U/L '22 19 19  '$ ALT 0 - 44 U/L 33 28 30  Alk Phosphatase 38 - 126 U/L 70 82 86  Total Bilirubin 0.3 - 1.2 mg/dL 0.4 0.6 0.6    Transaminitis: Mild due to viral infection, asymptomatic and resolved.  Bronchial asthma: No exacerbation.  Morbid Obesity: BMI of 36 follow with PCP for weight loss.   Rapidly rising D-dimer due to inflammation from COVID-19.  High risk for developing a blood clot.  Have increased to moderate dose Lovenox from 40 twice daily to 50 twice daily, will discharge her on 2 weeks of Eliquis at prophylactic dose, case manager to provide her with coupon for 1 month supply. Lower extremity venous duplex negative and clinically no signs of PE.  Will give her 2 weeks of Eliquis upon discharge coupon given.  D-dimer is trending down.    Discharge diagnosis     Principal Problem:   Acute respiratory disease due to COVID-19 virus Active Problems:   Acute on chronic respiratory failure with hypoxia (HCC)    Mild intermittent asthma    Discharge instructions    Discharge Instructions    Diet - low sodium heart healthy   Complete by: As directed    Discharge instructions   Complete by: As directed    Follow with Primary MD  in 7 days   Get CBC, CMP, 2 view Chest X ray -  checked next visit within 1 week by Primary MD Activity: As tolerated with Full fall precautions use walker/cane & assistance as needed  Disposition Home   Diet: Heart Healthy  Special Instructions: If you have smoked or chewed Tobacco  in the last 2 yrs please stop smoking, stop any regular Alcohol  and or any Recreational drug use.  On your next visit with your primary care physician please Get Medicines reviewed and adjusted.  Please request your Prim.MD to go over all Hospital Tests and Procedure/Radiological results at the follow up, please get all Hospital records sent to your Prim MD by signing hospital release before you go home.  If you experience worsening of your admission symptoms, develop shortness of breath, life threatening emergency, suicidal or homicidal thoughts you must seek medical attention immediately by calling 911 or calling your MD immediately  if symptoms less severe.  You Must read complete instructions/literature along with all the possible adverse reactions/side effects for all the Medicines you take and that have been prescribed to you. Take any new Medicines after you have completely understood and accpet all the possible adverse reactions/side effects.   Increase activity slowly   Complete by: As directed    MyChart COVID-19 home monitoring program   Complete by: Jan 15, 2020    Is the patient willing to use the [facility] for home monitoring?: Yes   Temperature monitoring   Complete by: Jan 15, 2020    After how many days would you like to receive a notification of this patient's flowsheet entries?: 1      Discharge Medications   Allergies as of 01/15/2020      Reactions    Aspirin Other (See Comments)   Ask   Penicillin  G Other (See Comments)   Unknown      Medication List    STOP taking these medications   Naproxen-Esomeprazole 375-20 MG Tbec Commonly known as: Vimovo   traMADol-acetaminophen 37.5-325 MG tablet Commonly known as: ULTRACET     TAKE these medications   albuterol 108 (90 Base) MCG/ACT inhaler Commonly known as: VENTOLIN HFA Inhale 2 puffs into the lungs every 4 (four) hours as needed for shortness of breath or wheezing.   apixaban 2.5 MG Tabs tablet Commonly known as: ELIQUIS Take 1 tablet (2.5 mg total) by mouth 2 (two) times daily.   methylPREDNISolone 4 MG Tbpk tablet Commonly known as: MEDROL DOSEPAK follow package directions   omeprazole 20 MG tablet Commonly known as: PRILOSEC OTC Take 20 mg by mouth daily as needed (acid reflux).       Follow-up Information    Post-COVID Care Clinic at [facility] Follow up on 01/22/2020.   Specialty: Family Medicine Why: an appointment has been made for you at 11:00. Please arrive 15 minutes early and bring any insurance cards you may have.  Contact information: [address] [place] [phone]          Major procedures and Radiology Reports - PLEASE review detailed and final reports thoroughly  -       DG Chest [facility] 1 View  Result Date: 01/11/2020 CLINICAL DATA:  54 year old female with COVID pneumonia EXAM: PORTABLE CHEST 1 VIEW COMPARISON:  Prior chest x-ray 01/09/2020 FINDINGS: Improved inspiratory volumes. Significant interval improved vulvar in diffuse bilateral patchy interstitial and airspace opacities in a predominantly peripheral and basilar distribution. Stable cardiac and mediastinal contours. No acute osseous abnormality. No large effusion or pneumothorax. IMPRESSION: Improving multifocal viral pneumonia. Improved inspiratory volume. Electronically Signed   By: Jacqulynn Cadet M.D.   On: 01/11/2020 09:32   VAS [place] LOWER EXTREMITY VENOUS  (DVT)  Result Date: 01/14/2020  Lower Venous DVTStudy Indications: Swelling, Edema, and ddimer.  Comparison Study: no prior Performing Technologist: Abram Sander RVS  Examination Guidelines: A complete evaluation includes B-mode imaging, spectral Doppler, color Doppler, and power Doppler as needed of all accessible portions of each vessel. Bilateral testing is considered an integral part of a complete examination. Limited examinations for reoccurring indications may be performed as noted. The reflux portion of the exam is performed with the patient in reverse Trendelenburg.  +---------+---------------+---------+-----------+----------+--------------+ RIGHT    CompressibilityPhasicitySpontaneityPropertiesThrombus Aging +---------+---------------+---------+-----------+----------+--------------+ CFV      Full           Yes      Yes                                 +---------+---------------+---------+-----------+----------+--------------+ SFJ      Full                                                        +---------+---------------+---------+-----------+----------+--------------+ FV Prox  Full                                                        +---------+---------------+---------+-----------+----------+--------------+ FV Mid   Full                                                        +---------+---------------+---------+-----------+----------+--------------+  FV DistalFull                                                        +---------+---------------+---------+-----------+----------+--------------+ PFV      Full                                                        +---------+---------------+---------+-----------+----------+--------------+ POP      Full           Yes      Yes                                 +---------+---------------+---------+-----------+----------+--------------+ PTV      Full                                                         +---------+---------------+---------+-----------+----------+--------------+ PERO     Full                                                        +---------+---------------+---------+-----------+----------+--------------+   +---------+---------------+---------+-----------+----------+--------------+ LEFT     CompressibilityPhasicitySpontaneityPropertiesThrombus Aging +---------+---------------+---------+-----------+----------+--------------+ CFV      Full           Yes      Yes                                 +---------+---------------+---------+-----------+----------+--------------+ SFJ      Full                                                        +---------+---------------+---------+-----------+----------+--------------+ FV Prox  Full                                                        +---------+---------------+---------+-----------+----------+--------------+ FV Mid   Full                                                        +---------+---------------+---------+-----------+----------+--------------+ FV DistalFull                                                        +---------+---------------+---------+-----------+----------+--------------+  PFV      Full                                                        +---------+---------------+---------+-----------+----------+--------------+ POP      Full           Yes      Yes                                 +---------+---------------+---------+-----------+----------+--------------+ PTV      Full                                                        +---------+---------------+---------+-----------+----------+--------------+ PERO     Full                                                        +---------+---------------+---------+-----------+----------+--------------+     Summary: BILATERAL: - No evidence of deep vein thrombosis seen in the lower extremities, bilaterally.   *See table(s)  above for measurements and observations. Electronically signed by Deitra Mayo MD on 01/14/2020 at 3:55:17 PM.    Final     Micro Results     Recent Results (from the past 240 hour(s))  MRSA PCR Screening     Status: None   Collection Time: 01/10/20  5:56 AM   Specimen: Nasal Mucosa; Nasopharyngeal  Result Value Ref Range Status   MRSA by PCR NEGATIVE NEGATIVE Final    Comment:        The GeneXpert MRSA Assay (FDA approved for NASAL specimens only), is one component of a comprehensive MRSA colonization surveillance program. It is not intended to diagnose MRSA infection nor to guide or monitor treatment for MRSA infections. Performed at [facility], [place] [address] [place], [place] [address]   Culture, blood (routine x 2)     Status: None   Collection Time: 01/10/20 11:35 AM   Specimen: BLOOD  Result Value Ref Range Status   Specimen Description BLOOD LEFT ANTECUBITAL  Final   Special Requests   Final    BOTTLES DRAWN AEROBIC AND ANAEROBIC Blood Culture adequate volume   Culture   Final    NO GROWTH 5 DAYS Performed at [facility], [place] [address] [place], [place] [address]    Report Status 01/15/2020 FINAL  Final  Culture, blood (routine x 2)     Status: None   Collection Time: 01/10/20 11:40 AM   Specimen: BLOOD RIGHT HAND  Result Value Ref Range Status   Specimen Description BLOOD RIGHT HAND  Final   Special Requests   Final    BOTTLES DRAWN AEROBIC ONLY Blood Culture adequate volume   Culture   Final    NO GROWTH 5 DAYS Performed at [facility], [place] [address] [place], [place] [address]    Report Status 01/15/2020 FINAL  Final    Today   Subjective  Kendra Collins today has no headache,no chest abdominal pain,no new weakness tingling or numbness, feels much better wants to go home today.     Objective   Blood pressure 128/80, pulse 84, temperature 97.7 F (36.5 C), temperature source Oral, resp. rate 19, height '5\' 1"'$  (1.549 m),  weight 88.1 kg, SpO2 95 %.   Intake/Output Summary (Last 24 hours) at 01/15/2020 0923 Last data filed at 01/15/2020 0815 Gross per 24 hour  Intake 240 ml  Output --  Net 240 ml    Exam Awake Alert,  No new F.N deficits, Normal affect [place].AT,PERRAL Supple Neck,No JVD, No cervical lymphadenopathy appriciated.  Symmetrical Chest wall movement, Good air movement bilaterally, CTAB RRR,No Gallops,Rubs or new Murmurs, No Parasternal Heave +ve B.Sounds, Abd Soft, Non tender, No organomegaly appriciated, No rebound -guarding or rigidity. No Cyanosis, Clubbing or edema, No new Rash or bruise   Data Review   CBC w Diff:  Lab Results  Component Value Date   WBC 12.3 (H) 01/14/2020   HGB 13.6 01/14/2020   HCT 41.6 01/14/2020   PLT 498 (H) 01/14/2020   LYMPHOPCT 20 01/14/2020   MONOPCT 9 01/14/2020   EOSPCT 0 01/14/2020   BASOPCT 0 01/14/2020    CMP:  Lab Results  Component Value Date   NA 141 01/14/2020   K 4.2 01/14/2020   CL 107 01/14/2020   CO2 24 01/14/2020   BUN 16 01/14/2020   CREATININE 0.59 01/14/2020   PROT 5.6 (L) 01/14/2020   ALBUMIN 2.4 (L) 01/14/2020   BILITOT 0.4 01/14/2020   ALKPHOS 70 01/14/2020   AST 22 01/14/2020   ALT 33 01/14/2020  .   Total Time in preparing paper work, data evaluation and todays exam - 81 minutes  Lala Lund M.D on 01/15/2020 at 9:23 AM  [facility]   Office  [phone]

## 2020-01-15 NOTE — TOC Transition Note (Signed)
Transition of Care [facility]) - CM/SW Discharge Note   Patient Details  Name: Kendra Collins MRN: [id number] Date of Birth: November 17, 1965  Transition of Care [facility]) CM/SW Contact:  Lawerance Sabal, RN Phone Number: 01/15/2020, 9:06 AM   Clinical Narrative:    [name] that patient will DC today. Appointment made at [facility] clinic for follow up. Patient will not need home oxygen per nurse and MD. Eliquis 30 dayto be given to patient by nurse. TOC pharmacy to fill Rxs.     Final next level of care: Home w Home Health Services Barriers to Discharge: No Barriers Identified   Patient Goals and CMS Choice Patient states their goals for this hospitalization and ongoing recovery are:: Pt states she is ready to get back home once she feels better CMS Medicare.gov Compare Post Acute Care list provided to:: Patient Choice offered to / list presented to : Patient  Discharge Placement                       Discharge Plan and Services     Post Acute Care Choice: Home Health                    HH Arranged: PT, OT [facility] Agency: [facility] Date [facility] Agency Contacted: 01/15/20 Time HH Agency Contacted: [phone] Representative spoke with at [facility] Agency: Kandee Keen  Social Determinants of Health (SDOH) Interventions     Readmission Risk Interventions No flowsheet data found.

## 2020-01-22 ENCOUNTER — Ambulatory Visit
Admission: RE | Admit: 2020-01-22 | Discharge: 2020-01-22 | Disposition: A | Discharge status: Home / Self Care | Payer: PRIVATE HEALTH INSURANCE | Source: Ambulatory Visit | Attending: Nurse Practitioner | Admitting: Nurse Practitioner

## 2020-01-22 ENCOUNTER — Other Ambulatory Visit: Payer: Self-pay

## 2020-01-22 ENCOUNTER — Ambulatory Visit (INDEPENDENT_AMBULATORY_CARE_PROVIDER_SITE_OTHER): Payer: PRIVATE HEALTH INSURANCE | Admitting: Nurse Practitioner

## 2020-01-22 VITALS — BP 148/98 | HR 93 | Temp 97.3°F | Wt 193.0 lb

## 2020-01-22 DIAGNOSIS — R5383 Other fatigue: Secondary | ICD-10-CM | POA: Insufficient documentation

## 2020-01-22 DIAGNOSIS — K649 Unspecified hemorrhoids: Secondary | ICD-10-CM | POA: Diagnosis not present

## 2020-01-22 DIAGNOSIS — U071 COVID-19: Secondary | ICD-10-CM | POA: Diagnosis not present

## 2020-01-22 DIAGNOSIS — J9601 Acute respiratory failure with hypoxia: Secondary | ICD-10-CM | POA: Diagnosis not present

## 2020-01-22 DIAGNOSIS — J1282 Pneumonia due to coronavirus disease 2019: Secondary | ICD-10-CM

## 2020-01-22 DIAGNOSIS — J069 Acute upper respiratory infection, unspecified: Secondary | ICD-10-CM

## 2020-01-22 MED ORDER — DOCUSATE SODIUM 100 MG PO CAPS
100.0000 mg | ORAL_CAPSULE | Freq: Two times a day (BID) | ORAL | 0 refills | Status: DC
Start: 2020-01-22 — End: 2023-03-14

## 2020-01-22 NOTE — Progress Notes (Signed)
@Patient  ID: Kendra Collins, female    DOB: 1966/09/07, 54 y.o.   MRN: [id number]  Chief Complaint  Patient presents with  . Hospitalization Follow-up    Post COVID, Tested postive 4/9 Was hospitalized from 4/22-4/28. Patient is still having multiple sx: Headaches, body aches, weakness, fatigue, chills, back pain. Patient stated she just developed hemorrhoids.     Referring provider: No ref. provider found   54 year old female with history of asthma. Diagnosed with Covid 12/27/19.   Recent significant events:  01/09/20 - 01/15/20 Hospital admission: Admitted for Covid pneumonia and acute hypoxic respiratory failure. Treated with high dose IV, remdesivir along with antibiotics.  Patient was discharged home on Medrol Dosepak and rescue inhaler.  Patient was also discharged on 2 weeks of Eliquis due to rapidly rising D-dimer due to inflammation from Covid.  Lower extremity venous duplex negative and clinically had no signs of PE.  Imaging:  01/11/20 Chest xray: Improving multifocal viral pneumonia. Improved inspiratory volume.  HPI  Patient presents today for post Covid care clinic visit.  Patient was discharged from hospital on 01/15/2020.  She states that she is doing well.  She does state that she is still having shortness of breath with a slight cough.  She states that the shortness of breath is with exertion.  Hospital discharge note states that patient was not sent home with O2 but patient states that she was sent home with O2 to use as needed with exertion.  Patient states that she does not currently have a PCP.  Patient lives in [place] and will need to find a PCP there.  Patient has been compliant with albuterol inhaler.  She is finishing her Medrol Dosepak. Patient complains today of constipation and hemorrhoids from straining while trying to use the bathroom. Denies f/c/s, n/v/d, hemoptysis, PND, chest pain or edema.    Note: Patient walked in office today O2 sats dropped to 88% on RA  with minimal exertion     Allergies  Allergen Reactions  . Aspirin Other (See Comments)    Ask  . Penicillin G Other (See Comments)    Unknown     There is no immunization history on file for this patient.  Past Medical History:  Diagnosis Date  . Asthma     Tobacco History: Social History   Tobacco Use  Smoking Status Former Smoker  Smokeless Tobacco Never Used  Tobacco Comment   none since ~2000    Counseling given: Not Answered Comment: none since ~2000    Outpatient Encounter Medications as of 01/22/2020  Medication Sig  . albuterol (VENTOLIN HFA) 108 (90 Base) MCG/ACT inhaler Inhale 2 puffs into the lungs every 4 (four) hours as needed for shortness of breath or wheezing.  Marland Kitchen apixaban (ELIQUIS) 2.5 MG TABS tablet Take 1 tablet (2.5 mg total) by mouth 2 (two) times daily.  Marland Kitchen omeprazole (PRILOSEC OTC) 20 MG tablet Take 20 mg by mouth daily as needed (acid reflux).  Marland Kitchen docusate sodium (COLACE) 100 MG capsule Take 1 capsule (100 mg total) by mouth 2 (two) times daily.  . methylPREDNISolone (MEDROL DOSEPAK) 4 MG TBPK tablet follow package directions (Patient not taking: Reported on 01/22/2020)   No facility-administered encounter medications on file as of 01/22/2020.     Review of Systems  Review of Systems  Constitutional: Negative.   HENT: Negative.   Respiratory: Positive for cough and shortness of breath.   Cardiovascular: Negative.  Negative for chest pain, palpitations and leg swelling.  Gastrointestinal: Positive for constipation and rectal pain.  Allergic/Immunologic: Negative.   Neurological: Negative.   Psychiatric/Behavioral: Negative.        Physical Exam  BP (!) 148/98 (BP Location: Left Arm, Patient Position: Sitting, Cuff Size: Small)   Pulse 93   Temp (!) 97.3 F (36.3 C)   Wt 193 lb (87.5 kg)   SpO2 98%   BMI 36.47 kg/m   Wt Readings from Last 5 Encounters:  01/22/20 193 lb (87.5 kg)  01/09/20 194 lb 3.6 oz (88.1 kg)  09/29/16 187 lb  (84.8 kg)  09/08/16 187 lb (84.8 kg)     Physical Exam Vitals and nursing note reviewed.  Constitutional:      General: She is not in acute distress.    Appearance: She is well-developed.  Cardiovascular:     Rate and Rhythm: Normal rate and regular rhythm.  Pulmonary:     Effort: Pulmonary effort is normal. No respiratory distress.     Breath sounds: Normal breath sounds. No wheezing or rhonchi.  Musculoskeletal:     Right lower leg: No edema.     Left lower leg: No edema.  Neurological:     Mental Status: She is alert and oriented to person, place, and time.      Imaging: DG Chest Port 1 View  Result Date: 01/11/2020 CLINICAL DATA:  54 year old female with COVID pneumonia EXAM: PORTABLE CHEST 1 VIEW COMPARISON:  Prior chest x-ray 01/09/2020 FINDINGS: Improved inspiratory volumes. Significant interval improved vulvar in diffuse bilateral patchy interstitial and airspace opacities in a predominantly peripheral and basilar distribution. Stable cardiac and mediastinal contours. No acute osseous abnormality. No large effusion or pneumothorax. IMPRESSION: Improving multifocal viral pneumonia. Improved inspiratory volume. Electronically Signed   By: Malachy Moan M.D.   On: 01/11/2020 09:32   VAS [place] LOWER EXTREMITY VENOUS (DVT)  Result Date: 01/14/2020  Lower Venous DVTStudy Indications: Swelling, Edema, and ddimer.  Comparison Study: no prior Performing Technologist: Blanch Media RVS  Examination Guidelines: A complete evaluation includes B-mode imaging, spectral Doppler, color Doppler, and power Doppler as needed of all accessible portions of each vessel. Bilateral testing is considered an integral part of a complete examination. Limited examinations for reoccurring indications may be performed as noted. The reflux portion of the exam is performed with the patient in reverse Trendelenburg.  +---------+---------------+---------+-----------+----------+--------------+ RIGHT     CompressibilityPhasicitySpontaneityPropertiesThrombus Aging +---------+---------------+---------+-----------+----------+--------------+ CFV      Full           Yes      Yes                                 +---------+---------------+---------+-----------+----------+--------------+ SFJ      Full                                                        +---------+---------------+---------+-----------+----------+--------------+ FV Prox  Full                                                        +---------+---------------+---------+-----------+----------+--------------+ FV Mid   Full                                                        +---------+---------------+---------+-----------+----------+--------------+  FV DistalFull                                                        +---------+---------------+---------+-----------+----------+--------------+ PFV      Full                                                        +---------+---------------+---------+-----------+----------+--------------+ POP      Full           Yes      Yes                                 +---------+---------------+---------+-----------+----------+--------------+ PTV      Full                                                        +---------+---------------+---------+-----------+----------+--------------+ PERO     Full                                                        +---------+---------------+---------+-----------+----------+--------------+   +---------+---------------+---------+-----------+----------+--------------+ LEFT     CompressibilityPhasicitySpontaneityPropertiesThrombus Aging +---------+---------------+---------+-----------+----------+--------------+ CFV      Full           Yes      Yes                                 +---------+---------------+---------+-----------+----------+--------------+ SFJ      Full                                                         +---------+---------------+---------+-----------+----------+--------------+ FV Prox  Full                                                        +---------+---------------+---------+-----------+----------+--------------+ FV Mid   Full                                                        +---------+---------------+---------+-----------+----------+--------------+ FV DistalFull                                                        +---------+---------------+---------+-----------+----------+--------------+  PFV      Full                                                        +---------+---------------+---------+-----------+----------+--------------+ POP      Full           Yes      Yes                                 +---------+---------------+---------+-----------+----------+--------------+ PTV      Full                                                        +---------+---------------+---------+-----------+----------+--------------+ PERO     Full                                                        +---------+---------------+---------+-----------+----------+--------------+     Summary: BILATERAL: - No evidence of deep vein thrombosis seen in the lower extremities, bilaterally.   *See table(s) above for measurements and observations. Electronically signed by Waverly Ferrari MD on 01/14/2020 at 3:55:17 PM.    Final      Assessment & Plan:   Pneumonia due to COVID-19 virus Covid pneumonia Acute hypoxic respiratory failure:  Will recheck chest x ray today and call with results  Continue O2 at home with exertion  to keep O2 sats above 90%  Patient walked in office today O2 sats dropped to 88% on RA with minimal exertion  May start mucinex DM    At risk for DVT/PE - elevated d-dimer in hospital:  Will recheck d-dimer today if elevated will need to take Eliquis for 2 more weeks - then have d-dimer rechecked   Hemorrhoids:    Will order colace  May use OTC cream or suppository  Follow up in 2 weeks or sooner if needed      Ivonne Andrew, NP 01/22/2020

## 2020-01-22 NOTE — Assessment & Plan Note (Signed)
Covid pneumonia Acute hypoxic respiratory failure:  Will recheck chest x ray today and call with results  Continue O2 at home with exertion  to keep O2 sats above 90%  Patient walked in office today O2 sats dropped to 88% on RA with minimal exertion  May start mucinex DM    At risk for DVT/PE - elevated d-dimer in hospital:  Will recheck d-dimer today if elevated will need to take Eliquis for 2 more weeks - then have d-dimer rechecked   Hemorrhoids:   Will order colace  May use OTC cream or suppository  Follow up in 2 weeks or sooner if needed   

## 2020-01-22 NOTE — Patient Instructions (Addendum)
Covid pneumonia Acute hypoxic respiratory failure:  Will recheck chest x ray today and call with results  Continue O2 at home with exertion  to keep O2 sats above 90%  Patient walked in office today O2 sats dropped to 88% on RA with minimal exertion  May start mucinex DM    At risk for DVT/PE - elevated d-dimer in hospital:  Will recheck d-dimer today if elevated will need to take Eliquis for 2 more weeks - then have d-dimer rechecked   Hemorrhoids:   Will order colace  May use OTC cream or suppository  Follow up in 2 weeks or sooner if needed

## 2020-01-23 LAB — CBC WITH DIFFERENTIAL/PLATELET
Basophils Absolute: 0 10*3/uL (ref 0.0–0.2)
Basos: 0 %
EOS (ABSOLUTE): 0.1 10*3/uL (ref 0.0–0.4)
Eos: 1 %
Hematocrit: 43.9 % (ref 34.0–46.6)
Hemoglobin: 14.8 g/dL (ref 11.1–15.9)
Immature Grans (Abs): 0.1 10*3/uL (ref 0.0–0.1)
Immature Granulocytes: 1 %
Lymphocytes Absolute: 2.6 10*3/uL (ref 0.7–3.1)
Lymphs: 20 %
MCH: 28.5 pg (ref 26.6–33.0)
MCHC: 33.7 g/dL (ref 31.5–35.7)
MCV: 84 fL (ref 79–97)
Monocytes Absolute: 1.1 10*3/uL — ABNORMAL HIGH (ref 0.1–0.9)
Monocytes: 8 %
Neutrophils Absolute: 8.9 10*3/uL — ABNORMAL HIGH (ref 1.4–7.0)
Neutrophils: 70 %
Platelets: 448 10*3/uL (ref 150–450)
RBC: 5.2 x10E6/uL (ref 3.77–5.28)
RDW: 14.5 % (ref 11.7–15.4)
WBC: 12.8 10*3/uL — ABNORMAL HIGH (ref 3.4–10.8)

## 2020-01-23 LAB — BASIC METABOLIC PANEL
BUN/Creatinine Ratio: 30 — ABNORMAL HIGH (ref 9–23)
BUN: 20 mg/dL (ref 6–24)
CO2: 26 mmol/L (ref 20–29)
Calcium: 9.5 mg/dL (ref 8.7–10.2)
Chloride: 99 mmol/L (ref 96–106)
Creatinine, Ser: 0.66 mg/dL (ref 0.57–1.00)
GFR calc Af Amer: 117 mL/min/{1.73_m2} (ref >59)
GFR calc non Af Amer: 102 mL/min/{1.73_m2} (ref >59)
Glucose: 90 mg/dL (ref 65–99)
Potassium: 4.2 mmol/L (ref 3.5–5.2)
Sodium: 139 mmol/L (ref 134–144)

## 2020-01-23 LAB — D-DIMER, QUANTITATIVE: D-DIMER: 0.83 mg/L FEU — ABNORMAL HIGH (ref 0.00–0.49)

## 2020-01-23 NOTE — Progress Notes (Signed)
Patient notified of results, verbally understood. No additional questions.

## 2020-02-05 ENCOUNTER — Ambulatory Visit: Payer: PRIVATE HEALTH INSURANCE | Admitting: Nurse Practitioner

## 2020-02-05 ENCOUNTER — Other Ambulatory Visit: Payer: Self-pay

## 2020-02-05 VITALS — BP 130/78 | HR 75 | Temp 97.5°F | Ht 61.0 in | Wt 194.8 lb

## 2020-02-05 DIAGNOSIS — Z8616 Personal history of COVID-19: Secondary | ICD-10-CM | POA: Diagnosis not present

## 2020-02-05 MED ORDER — BUDESONIDE-FORMOTEROL FUMARATE 80-4.5 MCG/ACT IN AERO
2.0000 | INHALATION_SPRAY | Freq: Two times a day (BID) | RESPIRATORY_TRACT | 3 refills | Status: DC
Start: 2020-02-05 — End: 2023-03-14

## 2020-02-05 MED ORDER — HYDROXYZINE HCL 25 MG PO TABS: 25.0000 mg | ORAL_TABLET | Freq: Three times a day (TID) | ORAL | 0 refills | Status: DC | PRN

## 2020-02-05 MED ORDER — MONTELUKAST SODIUM 10 MG PO TABS
10.0000 mg | ORAL_TABLET | Freq: Every day | ORAL | 3 refills | Status: DC
Start: 2020-02-05 — End: 2023-03-14

## 2020-02-05 NOTE — Assessment & Plan Note (Signed)
Pneumonia due to COVID-19 virus Acute hypoxic respiratory failure:  Assessment: Slowly improving. Patient walked in office today O2 sats remained above 93% for entire walk. Will need repeat chest x ray in a few weeks.   Plan: Continue O2 at home with exertion  to keep O2 sats above 90%  Continue deep breathing exercises   History of asthma:  Assessment: Has not been on any maintenance medications. Will start treatment regimen. If no improvement - may need pulmonary referral for PFT.   Plan: Will order Symbicort  Continue Proventil as needed  Will order Singulair    Anxiety and Depression:  Assessment: Patient has been hospitalized for Covid and lost both her parents. She states that her anxiety and depression has worsened. Offered referral for counseling - patient refuses, but states that she can get counseling through a friend/church member.   Plan: Will order hydroxyzine to take as needed for anxiety and insomnia   Please seek counseling  Follow up in 3 weeks - will need repeat x ray in 3 weeks

## 2020-02-05 NOTE — Progress Notes (Signed)
@Patient  ID: , female    DOB: July 21, 1966, 54 y.o.   MRN: [id number]  Chief Complaint  Patient presents with  . Follow-up    Patient stated she is still having fatigue, chills and headaches. Stated she is able to move around more without feeling SOB but does need to take breaks.     Referring provider: No ref. provider found   54 year old female with history of asthma. Diagnosed with Covid 12/27/19.   Recent significant events:  01/09/20 - 01/15/20 Hospital admission: Admitted for Covid pneumonia and acute hypoxic respiratory failure. Treated with high dose IV, remdesivir along with antibiotics.  Patient was discharged home on Medrol Dosepak and rescue inhaler.  Patient was also discharged on 2 weeks of Eliquis due to rapidly rising D-dimer due to inflammation from Covid.  Lower extremity venous duplex negative and clinically had no signs of PE.  01/22/20 - Post [facility] Care Center visit: Patient walked in office today O2 sats dropped to 88% on RA with minimal exertion. X ray checked - showed improvement, labs were improved. Ordered Colace for constipation.   Imaging:  01/11/20 Chest xray: Improving multifocal viral pneumonia. Improved inspiratory volume.  01/22/20 Chest x ray: Persistent but improving heterogeneous bilateral lung opacities consistent with improving pneumonia. Residual streaky opacities in the left suprahilar and infrahilar lung, and right mid and lower lung zone.  HPI   Patient presents today for post Covid care clinic visit follow-up.  Spanish interpreter used during visit.  She states that she is slowly improving.  At her last visit her chest x-ray she did show improvement and labs including D-dimer were improved.  Patient was able to discontinue anticoagulation.  Patient states she is still short of breath with exertion and continues to be fatigued.  She is trying to stay active but has to take frequent breaks.  She has not been using her oxygen.  She states  that she does check her O2 levels frequently and her O2 sats have remained in the mid to upper 90s.  She is using her incentive spirometer and flutter valve device at home as directed at hospital discharge.  She states that she has noticed a difference using these and feels that her lung volumes are improving.  Patient does have a history of asthma.  She has not been on any medication for this in several years due to concerns over cost.  She is currently using Ventolin inhaler as needed.  This was prescribed at hospital follow-up.  Patient also complains today of increased anxiety and depression.  She states that while she was hospitalized for Covid that both of her parents passed away.  She is having panic attacks at times due to this. Denies f/c/s, n/v/d, hemoptysis, PND, chest pain or edema.    Note: Patient walked in office today O2 sats remained above 93% for entire walk.      Allergies  Allergen Reactions  . Aspirin Other (See Comments)    Ask  . Penicillin G Other (See Comments)    Unknown     There is no immunization history on file for this patient.  Past Medical History:  Diagnosis Date  . Asthma     Tobacco History: Social History   Tobacco Use  Smoking Status Former Smoker  Smokeless Tobacco Never Used  Tobacco Comment   none since ~2000    Counseling given: Not Answered Comment: none since ~2000    Outpatient Encounter Medications as of 02/05/2020  Medication Sig  . albuterol (VENTOLIN HFA) 108 (90 Base) MCG/ACT inhaler Inhale 2 puffs into the lungs every 4 (four) hours as needed for shortness of breath or wheezing.  . docusate sodium (COLACE) 100 MG capsule Take 1 capsule (100 mg total) by mouth 2 (two) times daily.  Marland Kitchen omeprazole (PRILOSEC OTC) 20 MG tablet Take 20 mg by mouth daily as needed (acid reflux).  Marland Kitchen apixaban (ELIQUIS) 2.5 MG TABS tablet Take 1 tablet (2.5 mg total) by mouth 2 (two) times daily.  . budesonide-formoterol (SYMBICORT) 80-4.5 MCG/ACT  inhaler Inhale 2 puffs into the lungs 2 (two) times daily.  . hydrOXYzine (ATARAX/VISTARIL) 25 MG tablet Take 1 tablet (25 mg total) by mouth 3 (three) times daily as needed.  . montelukast (SINGULAIR) 10 MG tablet Take 1 tablet (10 mg total) by mouth at bedtime.  . [DISCONTINUED] methylPREDNISolone (MEDROL DOSEPAK) 4 MG TBPK tablet follow package directions (Patient not taking: Reported on 01/22/2020)   No facility-administered encounter medications on file as of 02/05/2020.     Review of Systems  Review of Systems  Constitutional: Positive for chills and fatigue.  HENT: Negative.   Respiratory: Positive for shortness of breath. Negative for cough.   Cardiovascular: Negative.  Negative for chest pain, palpitations and leg swelling.  Gastrointestinal: Negative.   Allergic/Immunologic: Negative.   Neurological: Negative.   Psychiatric/Behavioral: Negative.        Physical Exam  BP 130/78 (BP Location: Right Arm, Patient Position: Sitting, Cuff Size: Large)   Pulse 75   Temp (!) 97.5 F (36.4 C)   Ht 5\' 1"  (1.549 m)   Wt 194 lb 12 oz (88.3 kg)   SpO2 98%   BMI 36.80 kg/m   Wt Readings from Last 5 Encounters:  02/05/20 194 lb 12 oz (88.3 kg)  01/22/20 193 lb (87.5 kg)  01/09/20 194 lb 3.6 oz (88.1 kg)  09/29/16 187 lb (84.8 kg)  09/08/16 187 lb (84.8 kg)     Physical Exam Vitals and nursing note reviewed.  Constitutional:      General: She is not in acute distress.    Appearance: She is well-developed.  Cardiovascular:     Rate and Rhythm: Normal rate and regular rhythm.  Pulmonary:     Effort: Pulmonary effort is normal.     Breath sounds: Normal breath sounds.  Neurological:     Mental Status: She is alert and oriented to person, place, and time.  Psychiatric:        Mood and Affect: Mood normal.        Behavior: Behavior normal.       Imaging: DG Chest 2 View  Result Date: 01/22/2020 CLINICAL DATA:  COVID pneumonia. COVID positive 12/27/2018. Patient  reports persistent shortness of breath. EXAM: CHEST - 2 VIEW COMPARISON:  Radiograph 01/11/2020. FINDINGS: Persistent but improving heterogeneous bilateral lung opacities. There are residual streaky opacities in the left suprahilar and infrahilar lung, and right mid and lower lung zone. Overall improved lung volumes and decreased airspace disease from prior exam. The heart is normal in size with normal mediastinal contours. No pleural effusion, pneumothorax, or pulmonary edema. No acute osseous abnormalities are seen. IMPRESSION: Persistent but improving heterogeneous bilateral lung opacities consistent with improving pneumonia. Residual streaky opacities in the left suprahilar and infrahilar lung, and right mid and lower lung zone. Electronically Signed   By: 01/13/2020 M.D.   On: 01/22/2020 17:33   DG Chest Port 1 View  Result Date: 01/11/2020 CLINICAL DATA:  54 year old female with COVID pneumonia EXAM: PORTABLE CHEST 1 VIEW COMPARISON:  Prior chest x-ray 01/09/2020 FINDINGS: Improved inspiratory volumes. Significant interval improved vulvar in diffuse bilateral patchy interstitial and airspace opacities in a predominantly peripheral and basilar distribution. Stable cardiac and mediastinal contours. No acute osseous abnormality. No large effusion or pneumothorax. IMPRESSION: Improving multifocal viral pneumonia. Improved inspiratory volume. Electronically Signed   By: Malachy Moan M.D.   On: 01/11/2020 09:32   VAS [place] LOWER EXTREMITY VENOUS (DVT)  Result Date: 01/14/2020  Lower Venous DVTStudy Indications: Swelling, Edema, and ddimer.  Comparison Study: no prior Performing Technologist: Blanch Media RVS  Examination Guidelines: A complete evaluation includes B-mode imaging, spectral Doppler, color Doppler, and power Doppler as needed of all accessible portions of each vessel. Bilateral testing is considered an integral part of a complete examination. Limited examinations for reoccurring  indications may be performed as noted. The reflux portion of the exam is performed with the patient in reverse Trendelenburg.  +---------+---------------+---------+-----------+----------+--------------+ RIGHT    CompressibilityPhasicitySpontaneityPropertiesThrombus Aging +---------+---------------+---------+-----------+----------+--------------+ CFV      Full           Yes      Yes                                 +---------+---------------+---------+-----------+----------+--------------+ SFJ      Full                                                        +---------+---------------+---------+-----------+----------+--------------+ FV Prox  Full                                                        +---------+---------------+---------+-----------+----------+--------------+ FV Mid   Full                                                        +---------+---------------+---------+-----------+----------+--------------+ FV DistalFull                                                        +---------+---------------+---------+-----------+----------+--------------+ PFV      Full                                                        +---------+---------------+---------+-----------+----------+--------------+ POP      Full           Yes      Yes                                 +---------+---------------+---------+-----------+----------+--------------+ PTV  Full                                                        +---------+---------------+---------+-----------+----------+--------------+ PERO     Full                                                        +---------+---------------+---------+-----------+----------+--------------+   +---------+---------------+---------+-----------+----------+--------------+ LEFT     CompressibilityPhasicitySpontaneityPropertiesThrombus Aging  +---------+---------------+---------+-----------+----------+--------------+ CFV      Full           Yes      Yes                                 +---------+---------------+---------+-----------+----------+--------------+ SFJ      Full                                                        +---------+---------------+---------+-----------+----------+--------------+ FV Prox  Full                                                        +---------+---------------+---------+-----------+----------+--------------+ FV Mid   Full                                                        +---------+---------------+---------+-----------+----------+--------------+ FV DistalFull                                                        +---------+---------------+---------+-----------+----------+--------------+ PFV      Full                                                        +---------+---------------+---------+-----------+----------+--------------+ POP      Full           Yes      Yes                                 +---------+---------------+---------+-----------+----------+--------------+ PTV      Full                                                        +---------+---------------+---------+-----------+----------+--------------+  PERO     Full                                                        +---------+---------------+---------+-----------+----------+--------------+     Summary: BILATERAL: - No evidence of deep vein thrombosis seen in the lower extremities, bilaterally.   *See table(s) above for measurements and observations. Electronically signed by Deitra Mayo MD on 01/14/2020 at 3:55:17 PM.    Final      Assessment & Plan:   History of COVID-19 Pneumonia due to COVID-19 virus Acute hypoxic respiratory failure:  Assessment: Slowly improving. Patient walked in office today O2 sats remained above 93% for entire walk. Will need repeat chest x ray  in a few weeks.   Plan: Continue O2 at home with exertion  to keep O2 sats above 90%  Continue deep breathing exercises   History of asthma:  Assessment: Has not been on any maintenance medications. Will start treatment regimen. If no improvement - may need pulmonary referral for PFT.   Plan: Will order Symbicort  Continue Proventil as needed  Will order Singulair    Anxiety and Depression:  Assessment: Patient has been hospitalized for Covid and lost both her parents. She states that her anxiety and depression has worsened. Offered referral for counseling - patient refuses, but states that she can get counseling through a friend/church member.   Plan: Will order hydroxyzine to take as needed for anxiety and insomnia   Please seek counseling  Follow up in 3 weeks - will need repeat x ray in 3 weeks   Patient Instructions  Pneumonia due to COVID-19 virus Acute hypoxic respiratory failure:  Continue O2 at home with exertion  to keep O2 sats above 90%  Patient walked in office today O2 sats remained above 93% for entire walk  Continue deep breathing exercises  History of asthma:  Will order Symbicort  Continue Proventil as needed  Will order Singulair    Anxiety and Depression:  Will order hydroxyzine to take as needed for anxiety and insomnia   Please seek counseling  Follow up in 3 weeks - will need repeat x ray in 3 weeks      Fenton Foy, NP 02/05/2020

## 2020-02-05 NOTE — Patient Instructions (Addendum)
Pneumonia due to COVID-19 virus Acute hypoxic respiratory failure:  Continue O2 at home with exertion  to keep O2 sats above 90%  Patient walked in office today O2 sats remained above 93% for entire walk  Continue deep breathing exercises  History of asthma:  Will order Symbicort  Continue Proventil as needed  Will order Singulair    Anxiety and Depression:  Will order hydroxyzine to take as needed for anxiety and insomnia   Please seek counseling  Follow up in 3 weeks - will need repeat x ray in 3 weeks

## 2020-03-30 ENCOUNTER — Telehealth: Payer: Self-pay

## 2020-03-30 NOTE — Telephone Encounter (Signed)
Pt was contacted to move up her 11 am appt on 7/14 to 10 am. LVM - no answer

## 2020-04-01 ENCOUNTER — Ambulatory Visit: Payer: PRIVATE HEALTH INSURANCE

## 2020-04-06 ENCOUNTER — Ambulatory Visit: Payer: PRIVATE HEALTH INSURANCE

## 2020-04-08 ENCOUNTER — Ambulatory Visit
Admission: RE | Admit: 2020-04-08 | Discharge: 2020-04-08 | Disposition: A | Discharge status: Home / Self Care | Payer: No Typology Code available for payment source | Source: Ambulatory Visit | Attending: Nurse Practitioner | Admitting: Nurse Practitioner

## 2020-04-08 ENCOUNTER — Ambulatory Visit (INDEPENDENT_AMBULATORY_CARE_PROVIDER_SITE_OTHER): Payer: No Typology Code available for payment source | Admitting: Nurse Practitioner

## 2020-04-08 VITALS — BP 132/72 | HR 85 | Temp 97.5°F | Wt 198.0 lb

## 2020-04-08 DIAGNOSIS — J1282 Pneumonia due to coronavirus disease 2019: Secondary | ICD-10-CM

## 2020-04-08 DIAGNOSIS — U071 COVID-19: Secondary | ICD-10-CM

## 2020-04-08 DIAGNOSIS — Z8616 Personal history of COVID-19: Secondary | ICD-10-CM | POA: Diagnosis not present

## 2020-04-08 NOTE — Patient Instructions (Addendum)
Pneumonia due to COVID-19 virus Acute hypoxic respiratory failure:  Will order follow up chest xray  Continue O2 at home with exertion to keep O2 sats above 90% if needed  Continue deep breathing exercises   History of asthma:  Continue Proventil as needed  Continue Singulair    Anxiety and Depression:  Continue hydroxyzine to take as needed for anxiety and insomnia   Please seek counseling if needed  Follow up:  Follow up as needed

## 2020-04-08 NOTE — Progress Notes (Signed)
@Patient  ID: , female    DOB: Apr 02, 1966, 54 y.o.   MRN: [id number]  Chief Complaint  Patient presents with  . Follow-up    Felling alot better. No longer having sx.     Referring provider: No ref. provider found   54 year old female with history of asthma. Diagnosed with Covid 12/27/19.   Recent significant events:  01/09/20 - 01/15/20 Hospital admission: Admitted for Covid pneumonia and acute hypoxic respiratory failure. Treated with high dose IV,remdesivir along with antibiotics. Patient was discharged home on Medrol Dosepak and rescue inhaler. Patient was also discharged on 2 weeks of Eliquis due to rapidly rising D-dimer due to inflammation from Covid. Lower extremity venous duplex negative and clinically hadno signs of PE.  01/22/20 - Post Covid Care Center visit: Patient walked in office today O2 sats dropped to 88% on RA with minimal exertion. X ray checked - showed improvement, labs were improved. Ordered Colace for constipation.   02/05/20 Post Covid Care Center follow up: Symbicort and Singulair ordered. Hydroxyzine ordered for anxiety and sleep. Patient walked in office today O2 sats remained above 93% for entire walk.   Imaging:  01/11/20 Chest xray:Improving multifocal viral pneumonia. Improved inspiratory volume.  01/22/20 Chest x ray: Persistent but improving heterogeneous bilateral lung opacities consistent with improving pneumonia. Residual streaky opacities in the left suprahilar and infrahilar lung, and right mid and lower lung zone.  HPI  Patient presents today for post Covid care clinic visit follow-up.  She states that she is much improved since last visit.  She states that she is no longer short of breath.  She did not start Symbicort due to cost.  She is using albuterol inhaler just as needed and states that that is not very often.  She also started on Singulair.  She states that hydroxyzine ordered at last visit has helped with her anxiety.  She  states that she does not like to take a lot of medicine so she only takes this as needed at bedtime.  She has returned to work and states that been active and being around coworkers has helped her improve. Denies f/c/s, n/v/d, hemoptysis, PND, chest pain or edema.     Allergies  Allergen Reactions  . Aspirin Other (See Comments)    Ask  . Penicillin G Other (See Comments)    Unknown     There is no immunization history on file for this patient.  Past Medical History:  Diagnosis Date  . Asthma     Tobacco History: Social History   Tobacco Use  Smoking Status Former Smoker  Smokeless Tobacco Never Used  Tobacco Comment   none since ~2000    Counseling given: Yes Comment: none since ~2000    Outpatient Encounter Medications as of 04/08/2020  Medication Sig  . hydrOXYzine (ATARAX/VISTARIL) 25 MG tablet Take 1 tablet (25 mg total) by mouth 3 (three) times daily as needed.  . montelukast (SINGULAIR) 10 MG tablet Take 1 tablet (10 mg total) by mouth at bedtime.  04/10/2020 omeprazole (PRILOSEC OTC) 20 MG tablet Take 20 mg by mouth daily as needed (acid reflux).  Marland Kitchen albuterol (VENTOLIN HFA) 108 (90 Base) MCG/ACT inhaler Inhale 2 puffs into the lungs every 4 (four) hours as needed for shortness of breath or wheezing. (Patient not taking: Reported on 04/08/2020)  . budesonide-formoterol (SYMBICORT) 80-4.5 MCG/ACT inhaler Inhale 2 puffs into the lungs 2 (two) times daily. (Patient not taking: Reported on 04/08/2020)  . docusate sodium (COLACE)  100 MG capsule Take 1 capsule (100 mg total) by mouth 2 (two) times daily. (Patient not taking: Reported on 04/08/2020)  . [DISCONTINUED] apixaban (ELIQUIS) 2.5 MG TABS tablet Take 1 tablet (2.5 mg total) by mouth 2 (two) times daily.   No facility-administered encounter medications on file as of 04/08/2020.     Review of Systems  Review of Systems  Constitutional: Positive for fever. Negative for chills and fatigue.  HENT: Negative.   Respiratory:  Negative for cough, shortness of breath and wheezing.   Cardiovascular: Negative.  Negative for chest pain, palpitations and leg swelling.  Gastrointestinal: Negative.   Allergic/Immunologic: Negative.   Neurological: Negative.   Psychiatric/Behavioral: Negative.        Physical Exam  BP 132/72   Pulse 85   Temp (!) 97.5 F (36.4 C)   Wt 198 lb 0.2 oz (89.8 kg)   SpO2 95%   BMI 37.41 kg/m   Wt Readings from Last 5 Encounters:  04/08/20 198 lb 0.2 oz (89.8 kg)  02/05/20 194 lb 12 oz (88.3 kg)  01/22/20 193 lb (87.5 kg)  01/09/20 194 lb 3.6 oz (88.1 kg)  09/29/16 187 lb (84.8 kg)     Physical Exam Vitals and nursing note reviewed.  Constitutional:      General: She is not in acute distress.    Appearance: She is well-developed.  Cardiovascular:     Rate and Rhythm: Normal rate and regular rhythm.  Pulmonary:     Effort: Pulmonary effort is normal.     Breath sounds: Normal breath sounds.  Neurological:     Mental Status: She is alert and oriented to person, place, and time.        Assessment & Plan:   History of COVID-19 Pneumonia due to COVID-19 virus Acute hypoxic respiratory failure:  Assessment: Much improved since last visit.   Plan:  Will order follow up chest xray  Continue O2 at home with exertion to keep O2 sats above 90% if needed  Continue deep breathing exercises   History of asthma:  Assessment: Much improved. Did not start symbicort due to cost.   Plan:  Continue Proventil as needed  Continue Singulair    Anxiety and Depression:  Assessment: Much improved. Only uses hydroxyzine as needed.   Plan: Continue hydroxyzine to take as needed for anxiety and insomnia   Please seek counseling if needed  Follow up:  Follow up as needed      Ivonne Andrew, NP 04/08/2020

## 2020-04-08 NOTE — Assessment & Plan Note (Signed)
Pneumonia due to COVID-19 virus Acute hypoxic respiratory failure:  Assessment: Much improved since last visit.   Plan:  Will order follow up chest xray  Continue O2 at home with exertion to keep O2 sats above 90% if needed  Continue deep breathing exercises   History of asthma:  Assessment: Much improved. Did not start symbicort due to cost.   Plan:  Continue Proventil as needed  Continue Singulair    Anxiety and Depression:  Assessment: Much improved. Only uses hydroxyzine as needed.   Plan: Continue hydroxyzine to take as needed for anxiety and insomnia   Please seek counseling if needed  Follow up:  Follow up as needed

## 2020-04-13 ENCOUNTER — Telehealth: Payer: Self-pay

## 2020-04-13 NOTE — Telephone Encounter (Signed)
I informed pt that xray indicates that things are retuning back to normal, however, she would like more detail. Would also like to know if she can now receive the vaxx. Please return call when you're available

## 2020-04-14 NOTE — Telephone Encounter (Signed)
Attempted to call patient - no answer - left voice mail to return call

## 2020-04-20 ENCOUNTER — Other Ambulatory Visit: Payer: Self-pay | Admitting: Nurse Practitioner

## 2020-06-09 ENCOUNTER — Ambulatory Visit: Payer: No Typology Code available for payment source

## 2020-06-15 ENCOUNTER — Ambulatory Visit: Payer: No Typology Code available for payment source

## 2020-12-15 IMAGING — CR DG CHEST 2V
2 series · 2 of 2 positions shown · non-contrast
Comparison: Radiograph 01/11/2020.

CLINICAL DATA: COVID pneumonia. COVID positive 12/27/2018. Patient
reports persistent shortness of breath.

EXAM:
CHEST - 2 VIEW

[w chest pa]
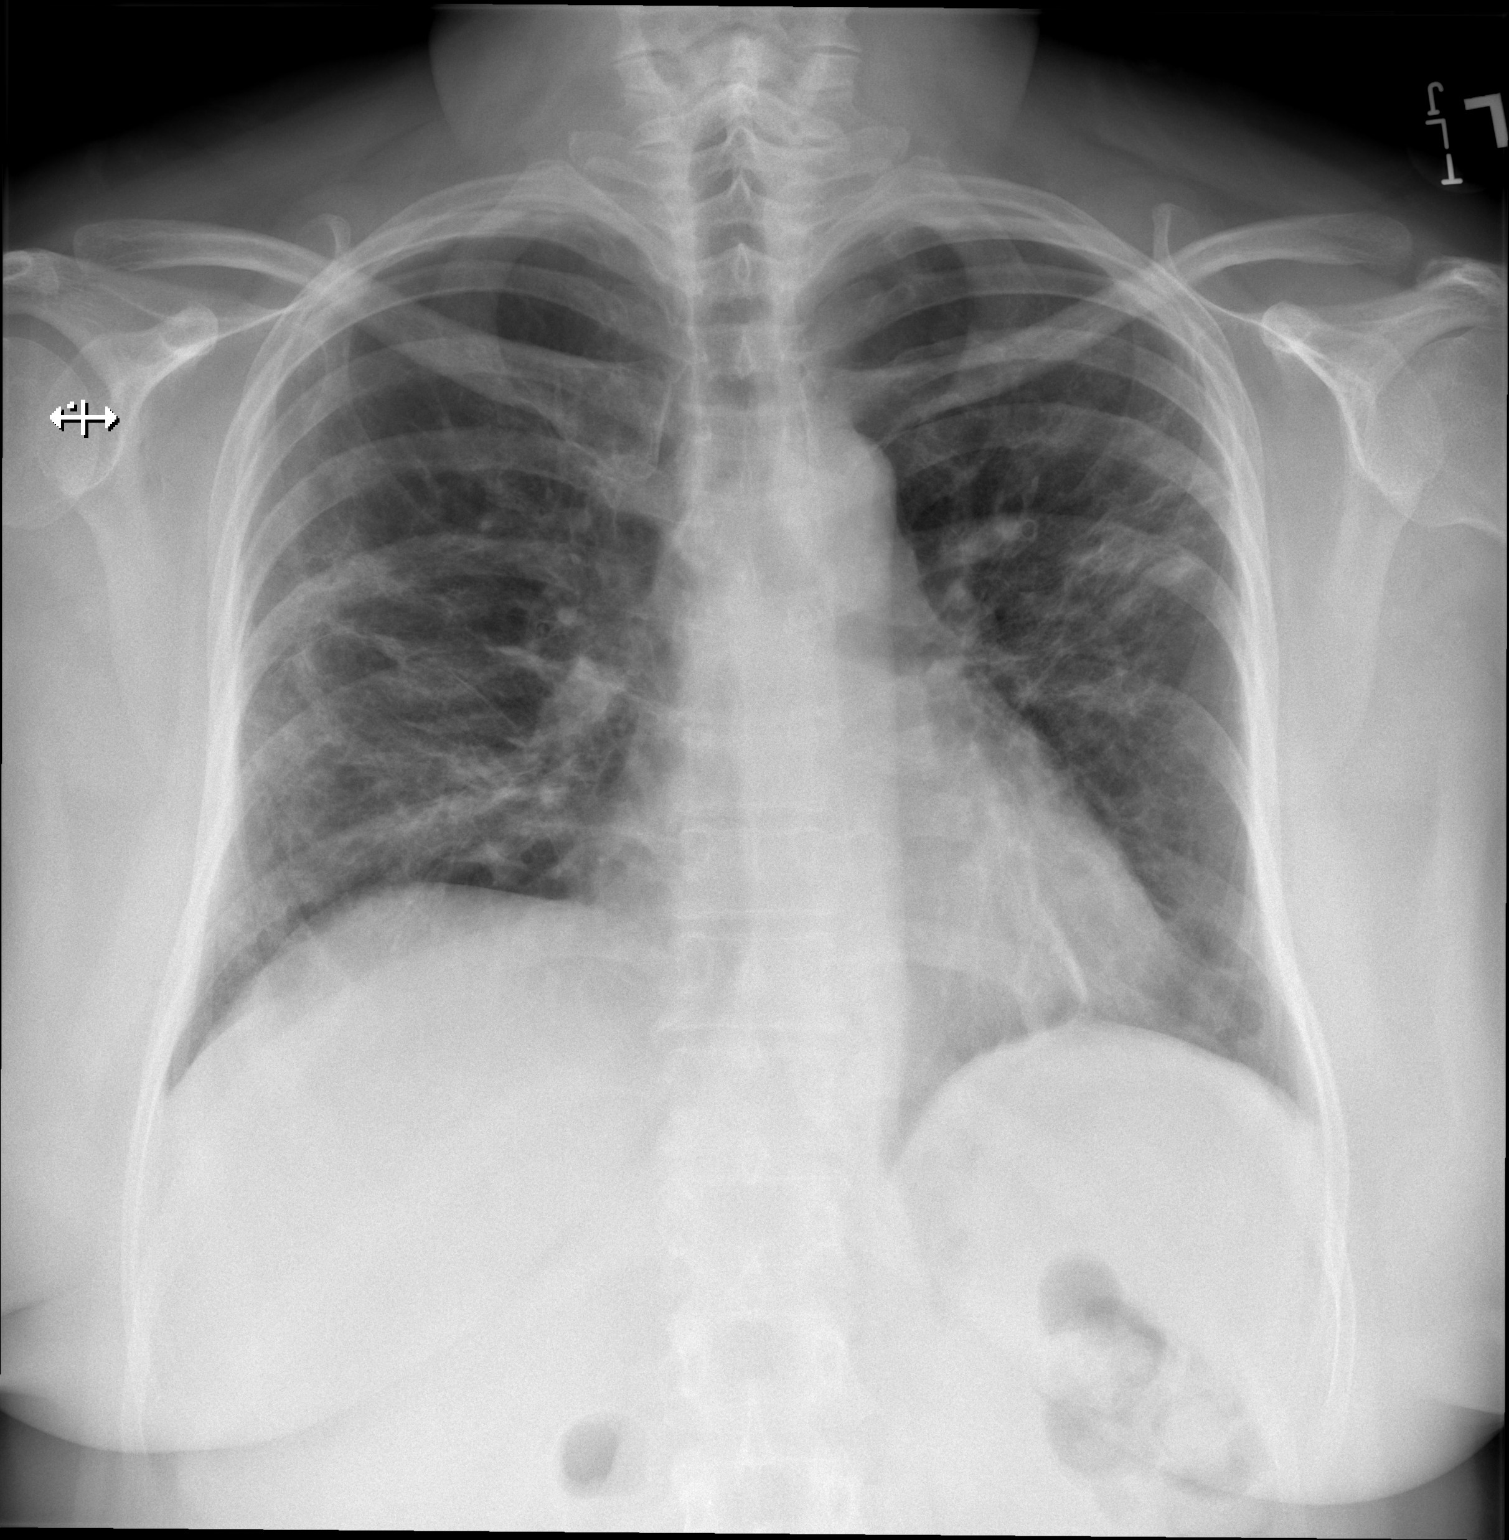

[w chest lat]
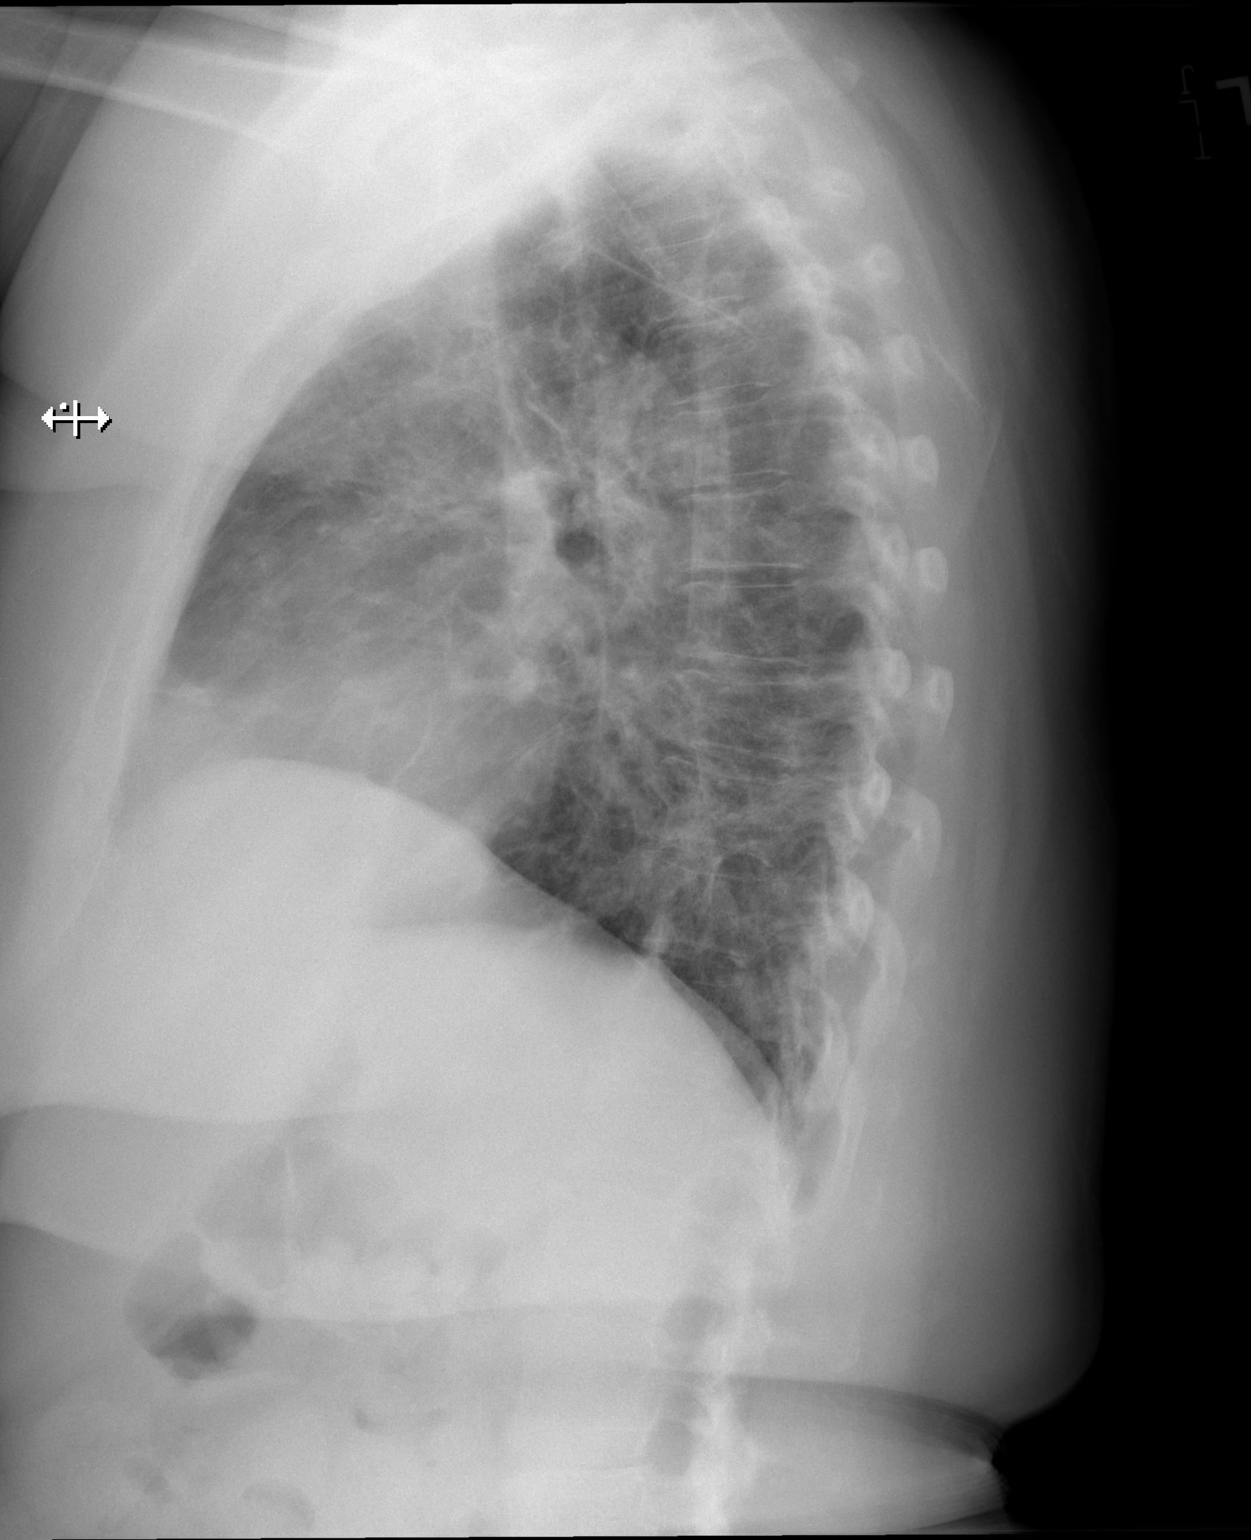

[2 of 2 positions shown; findings below may reference images not displayed]

FINDINGS: Persistent but improving heterogeneous bilateral lung opacities.
There are residual streaky opacities in the left suprahilar and
infrahilar lung, and right mid and lower lung zone. Overall improved
lung volumes and decreased airspace disease from prior exam. The
heart is normal in size with normal mediastinal contours. No pleural
effusion, pneumothorax, or pulmonary edema. No acute osseous
abnormalities are seen.
IMPRESSION: Persistent but improving heterogeneous bilateral lung opacities
consistent with improving pneumonia. Residual streaky opacities in
the left suprahilar and infrahilar lung, and right mid and lower
lung zone.

## 2023-01-31 NOTE — Progress Notes (Signed)
Initial phone contact with newly diagnosed pt. Appointment made for pt with Dr. Marvis Moeller for Tuesday, May 21st, 2024 at 10:15. Encouraged pt to pick up copy of "The Breast Cancer Treatment Handbook".  Gave location of visit and contact number for navigator. Encouraged pt to call with question or concerns.

## 2023-03-13 NOTE — Progress Notes (Unsigned)
[facility]  [address] [place],  [place]  [address] [phone]  Clinic Day:  03/14/2023  Referring physician: Maris Berger, MD   HISTORY OF PRESENT ILLNESS:  Kendra Collins is a 57 y.o. female who I was asked to consult upon for newly diagnosed breast cancer.  Her history dates back to early April 2024 when she felt an area of discomfort in her left upper breast.  This led to a screening mammogram being done shortly thereafter.  As an abnormality was seen in her left breast, a diagnostic mammogram was done 11 days later, which showed a 1.2 cm focus of asymmetry, which was not seen per ultrasound.  A biopsy of this lesion was done, which came back positive for invasive ductal carcinoma.  This led to the patient undergoing a left breast lumpectomy in June 2024, which revealed a 1.6 cm invasive ductal carcinoma.  Receptor testing showed her tumor to be estrogen receptor positive, progesterone receptor positive, but Her2/neu receptor negative.  Two sentinel lymph nodes were removed, which both came back negative for nodal involvement.  Her KI-67 score came back at 20%.  The patient comes in today to go over her breast cancer surgical pathology and its implications.  Of note, her last mammogram was in 2018.  She claims she had been trying to get an annual mammogram scheduled for years, but this follow-up was never established.  She denies having a family history of either breast or ovarian cancer.    PAST MEDICAL HISTORY:   Past Medical History:  Diagnosis Date   Anxiety    Asthma    Breast cancer (HCC)    Depression    GERD (gastroesophageal reflux disease)     PAST SURGICAL HISTORY:   Past Surgical History:  Procedure Laterality Date   ABDOMINAL HYSTERECTOMY  2016   PARTIAL - PT STILL HAS OVARIES   BIOPSY BREAST Left    2018   BREAST BIOPSY Left    2024   BREAST LUMPECTOMY Left    2024   LYMPH NODE BIOPSY Left    AXILLARY 2024   TUBAL  LIGATION  2006    CURRENT MEDICATIONS:   Current Outpatient Medications  Medication Sig Dispense Refill   acetaminophen (TYLENOL) 325 MG tablet Take 650 mg by mouth every 6 (six) hours as needed.     albuterol (VENTOLIN HFA) 108 (90 Base) MCG/ACT inhaler Inhale 2 puffs into the lungs every 4 (four) hours as needed for shortness of breath or wheezing. (Patient not taking: Reported on 04/08/2020)     omeprazole (PRILOSEC OTC) 20 MG tablet Take 20 mg by mouth daily as needed (acid reflux).     No current facility-administered medications for this visit.    ALLERGIES:   Allergies  Allergen Reactions   Aspirin Other (See Comments)    Ask   Penicillin G Other (See Comments)    Unknown   Sulfamethoxazole-Trimethoprim Other (See Comments) and Rash    FAMILY HISTORY:   Family History  Problem Relation Age of Onset   Colitis Mother    Osteoporosis Mother    CVA Father    Heart disease Father    Heart attack Father    Hyperlipidemia Father    Colitis Sister    Heart disease Brother    Osteoporosis Brother     SOCIAL HISTORY:  The patient was born and raised in [place].  She currently lives in town with her husband of 28 years.  They have 3 children and 7 grandchildren.  She works at a [employer] services [employer].  There is no history of alcoholism or tobacco abuse.  REVIEW OF SYSTEMS:  Review of Systems  Constitutional:  Positive for fatigue. Negative for fever.  HENT:   Negative for hearing loss and sore throat.   Eyes:  Negative for eye problems.  Respiratory:  Negative for chest tightness, cough and hemoptysis.   Cardiovascular:  Negative for chest pain and palpitations.  Gastrointestinal:  Negative for abdominal distention, abdominal pain, blood in stool, constipation, diarrhea, nausea and vomiting.  Endocrine: Negative for hot flashes.  Genitourinary:  Positive for dysuria. Negative for difficulty urinating, frequency, hematuria and nocturia.   Musculoskeletal:  Negative for  arthralgias, back pain, gait problem and myalgias.  Skin: Negative.  Negative for itching and rash.  Neurological:  Positive for headaches. Negative for dizziness, extremity weakness, gait problem, light-headedness and numbness.  Hematological: Negative.   Psychiatric/Behavioral:  Negative for depression and suicidal ideas. The patient is nervous/anxious.      PHYSICAL EXAM:  Blood pressure (!) 163/87, pulse 68, temperature 97.8 F (36.6 C), resp. rate 14, height 5\' 1"  (1.549 m), weight 206 lb 14.4 oz (93.8 kg), SpO2 96 %. Wt Readings from Last 3 Encounters:  03/14/23 206 lb 14.4 oz (93.8 kg)  04/08/20 198 lb 0.2 oz (89.8 kg)  02/05/20 194 lb 12 oz (88.3 kg)   Body mass index is 39.09 kg/m. Performance status (ECOG): 0 - Asymptomatic Physical Exam Constitutional:      Appearance: Normal appearance.  HENT:     Mouth/Throat:     Pharynx: Oropharynx is clear. No oropharyngeal exudate.  Cardiovascular:     Rate and Rhythm: Normal rate and regular rhythm.     Heart sounds: No murmur heard.    No friction rub. No gallop.  Pulmonary:     Breath sounds: Normal breath sounds.  Chest:  Breasts:    Right: No swelling, bleeding, inverted nipple, mass, nipple discharge or skin change.     Left: No swelling, bleeding, inverted nipple, mass, nipple discharge or skin change.     Comments: Her left breast has healed very well from her recent surgery. Abdominal:     General: Bowel sounds are normal. There is no distension.     Palpations: Abdomen is soft. There is no mass.     Tenderness: There is no abdominal tenderness.  Musculoskeletal:        General: No tenderness.     Cervical back: Normal range of motion and neck supple.     Right lower leg: No edema.     Left lower leg: No edema.  Lymphadenopathy:     Cervical: No cervical adenopathy.     Right cervical: No superficial, deep or posterior cervical adenopathy.    Left cervical: No superficial, deep or posterior cervical adenopathy.      Upper Body:     Right upper body: No supraclavicular or axillary adenopathy.     Left upper body: No supraclavicular or axillary adenopathy.     Lower Body: No right inguinal adenopathy. No left inguinal adenopathy.  Skin:    Coloration: Skin is not jaundiced.     Findings: No lesion or rash.  Neurological:     General: No focal deficit present.     Mental Status: She is alert and oriented to person, place, and time. Mental status is at baseline.  Psychiatric:        Mood and Affect: Mood  normal.        Behavior: Behavior normal.        Thought Content: Thought content normal.        Judgment: Judgment normal.    ASSESSMENT & PLAN:   JAILYN LEESON is a 57 y.o. female who I was asked to consult upon for newly diagnosed stage 1A (T1c N0 M0) hormone positive breast cancer, status post a left breast lumpectomy in June 2024.  In clinic today, I went over all of her surgical pathology with her.  As she has hormone positive, lymph node negative breast cancer, I will send her tumor for Oncotype testing to determine if there is a need for adjuvant chemotherapy.  The patient already understands she will need adjuvant endocrine therapy for 5 years, as well as adjuvant breast radiation.  If her Oncotype testing shows a high risk of distant breast cancer recurrence over the next 9-10 years, then I would also give her adjuvant chemotherapy, likely in the form of 4 cycles of Taxotere/Cytoxan.  I will see this patient back in 3 weeks to go over her Oncotype test results, which will be used to formulate her adjuvant breast cancer treatment plan.  The patient understands all the plans discussed today and is in agreement with them.  I do appreciate Sistasis, Rowena, MD for his new consult.   Dai Mcadams Kirby Funk, MD

## 2023-03-14 ENCOUNTER — Inpatient Hospital Stay: Payer: No Typology Code available for payment source | Attending: Oncology | Admitting: Oncology

## 2023-03-14 ENCOUNTER — Inpatient Hospital Stay: Payer: No Typology Code available for payment source

## 2023-03-14 ENCOUNTER — Encounter: Payer: Self-pay | Admitting: Oncology

## 2023-03-14 VITALS — BP 163/87 | HR 68 | Temp 97.8°F | Resp 14 | Ht 61.0 in | Wt 206.9 lb

## 2023-03-14 DIAGNOSIS — Z170 Estrogen receptor positive status [ER+]: Secondary | ICD-10-CM

## 2023-03-14 DIAGNOSIS — C50912 Malignant neoplasm of unspecified site of left female breast: Secondary | ICD-10-CM | POA: Diagnosis not present

## 2023-03-14 DIAGNOSIS — C50919 Malignant neoplasm of unspecified site of unspecified female breast: Secondary | ICD-10-CM | POA: Insufficient documentation

## 2023-03-15 ENCOUNTER — Telehealth: Payer: Self-pay | Admitting: Oncology

## 2023-03-15 ENCOUNTER — Telehealth: Payer: Self-pay | Admitting: Licensed Clinical Social Worker

## 2023-03-15 NOTE — Telephone Encounter (Signed)
[facility] Clinical Social Work  Clinical Social Work was referred by medical provider for new patient assessment.  Clinical Social Worker attempted to contact patient by phone to offer support and assess for needs via Spanish pacific interpreter [phone].   No answer. No VM available, unable to leave message.     Luster Hechler E Jailyn Langhorst, LCSW  Clinical Social Worker [facility]

## 2023-03-15 NOTE — Telephone Encounter (Signed)
Patient has been scheduled. Aware of appt date and time   Scheduling Message Entered by Rennis Harding A on 03/15/2023 at  1:40 AM Priority: Routine <No visit type provided>  Department: [facility]  Provider:  Scheduling Notes:  Appt on 04-04-23

## 2023-04-09 NOTE — Progress Notes (Unsigned)
[facility]  [address] [place],  [place]  [address] [phone]  Clinic Day:  04/09/2023  Referring physician: Maris Berger, MD   HISTORY OF PRESENT ILLNESS:  Kendra Collins is a 57 y.o. female who I was asked to consult upon for newly diagnosed breast cancer.  Her history dates back to early April 2024 when she felt an area of discomfort in her left upper breast.  This led to a screening mammogram being done shortly thereafter.  As an abnormality was seen in her left breast, a diagnostic mammogram was done 11 days later, which showed a 1.2 cm focus of asymmetry, which was not seen per ultrasound.  A biopsy of this lesion was done, which came back positive for invasive ductal carcinoma.  This led to the patient undergoing a left breast lumpectomy in June 2024, which revealed a 1.6 cm invasive ductal carcinoma.  Receptor testing showed her tumor to be estrogen receptor positive, progesterone receptor positive, but Her2/neu receptor negative.  Two sentinel lymph nodes were removed, which both came back negative for nodal involvement.  Her KI-67 score came back at 20%.  The patient comes in today to go over her breast cancer surgical pathology and its implications.  Of note, her last mammogram was in 2018.  She claims she had been trying to get an annual mammogram scheduled for years, but this follow-up was never established.  She denies having a family history of either breast or ovarian cancer.    PAST MEDICAL HISTORY:   Past Medical History:  Diagnosis Date   Anxiety    Asthma    Breast cancer (HCC)    Depression    GERD (gastroesophageal reflux disease)     PAST SURGICAL HISTORY:   Past Surgical History:  Procedure Laterality Date   ABDOMINAL HYSTERECTOMY  2016   PARTIAL - PT STILL HAS OVARIES   BIOPSY BREAST Left    2018   BREAST BIOPSY Left    2024   BREAST LUMPECTOMY Left    2024   LYMPH NODE BIOPSY Left    AXILLARY 2024   TUBAL  LIGATION  2006    CURRENT MEDICATIONS:   Current Outpatient Medications  Medication Sig Dispense Refill   acetaminophen (TYLENOL) 325 MG tablet Take 650 mg by mouth every 6 (six) hours as needed.     albuterol (VENTOLIN HFA) 108 (90 Base) MCG/ACT inhaler Inhale 2 puffs into the lungs every 4 (four) hours as needed for shortness of breath or wheezing. (Patient not taking: Reported on 04/08/2020)     omeprazole (PRILOSEC OTC) 20 MG tablet Take 20 mg by mouth daily as needed (acid reflux).     No current facility-administered medications for this visit.    ALLERGIES:   Allergies  Allergen Reactions   Aspirin Other (See Comments)    Ask   Penicillin G Other (See Comments)    Unknown   Sulfamethoxazole-Trimethoprim Other (See Comments) and Rash    FAMILY HISTORY:   Family History  Problem Relation Age of Onset   Colitis Mother    Osteoporosis Mother    CVA Father    Heart disease Father    Heart attack Father    Hyperlipidemia Father    Colitis Sister    Heart disease Brother    Osteoporosis Brother     SOCIAL HISTORY:  The patient was born and raised in [place].  She currently lives in town with her husband of 28 years.  They have 3 children and 7 grandchildren.  She works at a Building services engineer.  There is no history of alcoholism or tobacco abuse.  REVIEW OF SYSTEMS:  Review of Systems  Constitutional:  Positive for fatigue. Negative for fever.  HENT:   Negative for hearing loss and sore throat.   Eyes:  Negative for eye problems.  Respiratory:  Negative for chest tightness, cough and hemoptysis.   Cardiovascular:  Negative for chest pain and palpitations.  Gastrointestinal:  Negative for abdominal distention, abdominal pain, blood in stool, constipation, diarrhea, nausea and vomiting.  Endocrine: Negative for hot flashes.  Genitourinary:  Positive for dysuria. Negative for difficulty urinating, frequency, hematuria and nocturia.   Musculoskeletal:  Negative for  arthralgias, back pain, gait problem and myalgias.  Skin: Negative.  Negative for itching and rash.  Neurological:  Positive for headaches. Negative for dizziness, extremity weakness, gait problem, light-headedness and numbness.  Hematological: Negative.   Psychiatric/Behavioral:  Negative for depression and suicidal ideas. The patient is nervous/anxious.      PHYSICAL EXAM:  There were no vitals taken for this visit. Wt Readings from Last 3 Encounters:  03/14/23 206 lb 14.4 oz (93.8 kg)  04/08/20 198 lb 0.2 oz (89.8 kg)  02/05/20 194 lb 12 oz (88.3 kg)   There is no height or weight on file to calculate BMI. Performance status (ECOG): 0 - Asymptomatic Physical Exam Constitutional:      Appearance: Normal appearance.  HENT:     Mouth/Throat:     Pharynx: Oropharynx is clear. No oropharyngeal exudate.  Cardiovascular:     Rate and Rhythm: Normal rate and regular rhythm.     Heart sounds: No murmur heard.    No friction rub. No gallop.  Pulmonary:     Breath sounds: Normal breath sounds.  Chest:  Breasts:    Right: No swelling, bleeding, inverted nipple, mass, nipple discharge or skin change.     Left: No swelling, bleeding, inverted nipple, mass, nipple discharge or skin change.     Comments: Her left breast has healed very well from her recent surgery. Abdominal:     General: Bowel sounds are normal. There is no distension.     Palpations: Abdomen is soft. There is no mass.     Tenderness: There is no abdominal tenderness.  Musculoskeletal:        General: No tenderness.     Cervical back: Normal range of motion and neck supple.     Right lower leg: No edema.     Left lower leg: No edema.  Lymphadenopathy:     Cervical: No cervical adenopathy.     Right cervical: No superficial, deep or posterior cervical adenopathy.    Left cervical: No superficial, deep or posterior cervical adenopathy.     Upper Body:     Right upper body: No supraclavicular or axillary adenopathy.      Left upper body: No supraclavicular or axillary adenopathy.     Lower Body: No right inguinal adenopathy. No left inguinal adenopathy.  Skin:    Coloration: Skin is not jaundiced.     Findings: No lesion or rash.  Neurological:     General: No focal deficit present.     Mental Status: She is alert and oriented to person, place, and time. Mental status is at baseline.  Psychiatric:        Mood and Affect: Mood normal.        Behavior: Behavior normal.  Thought Content: Thought content normal.        Judgment: Judgment normal.    ASSESSMENT & PLAN:   Kendra Collins is a 57 y.o. female who I was asked to consult upon for newly diagnosed stage 1A (T1c N0 M0) hormone positive breast cancer, status post a left breast lumpectomy in June 2024.  In clinic today, I went over all of her surgical pathology with her.  As she has hormone positive, lymph node negative breast cancer, I will send her tumor for Oncotype testing to determine if there is a need for adjuvant chemotherapy.  The patient already understands she will need adjuvant endocrine therapy for 5 years, as well as adjuvant breast radiation.  If her Oncotype testing shows a high risk of distant breast cancer recurrence over the next 9-10 years, then I would also give her adjuvant chemotherapy, likely in the form of 4 cycles of Taxotere/Cytoxan.  I will see this patient back in 3 weeks to go over her Oncotype test results, which will be used to formulate her adjuvant breast cancer treatment plan.  The patient understands all the plans discussed today and is in agreement with them.  I do appreciate Sistasis, Rowena, MD for his new consult.   Mattson Dayal Kirby Funk, MD

## 2023-04-10 ENCOUNTER — Inpatient Hospital Stay: Payer: No Typology Code available for payment source | Admitting: Oncology

## 2023-04-17 ENCOUNTER — Inpatient Hospital Stay: Payer: No Typology Code available for payment source | Attending: Oncology | Admitting: Oncology

## 2023-04-17 VITALS — BP 139/74 | HR 76 | Temp 98.4°F | Resp 16 | Ht 61.0 in | Wt 211.0 lb

## 2023-04-17 DIAGNOSIS — C50412 Malignant neoplasm of upper-outer quadrant of left female breast: Secondary | ICD-10-CM | POA: Diagnosis not present

## 2023-04-17 DIAGNOSIS — Z170 Estrogen receptor positive status [ER+]: Secondary | ICD-10-CM

## 2023-04-17 NOTE — Progress Notes (Signed)
[facility]  [address] [place],  [place]  [address] [phone]  Clinic Day:  04/18/2023  Referring physician: Maris Berger, MD   HISTORY OF PRESENT ILLNESS:  Kendra Collins is a 57 y.o. 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   She comes in today to go over her Oncotype test results to determine if adjuvant chemotherapy is necessary.  Since her last visit, the patient has been doing well.  Her left breast continues to heal well after her lumpectomy.    PHYSICAL EXAM:  Blood pressure 139/74, pulse 76, temperature 98.4 F (36.9 C), resp. rate 16, height 5\' 1"  (1.549 m), weight 211 lb (95.7 kg), SpO2 94%. Wt Readings from Last 3 Encounters:  04/17/23 211 lb (95.7 kg)  03/14/23 206 lb 14.4 oz (93.8 kg)  04/08/20 198 lb 0.2 oz (89.8 kg)   Body mass index is 39.87 kg/m. Performance status (ECOG): 0 - Asymptomatic Physical Exam Constitutional:      Appearance: Normal appearance.  HENT:     Mouth/Throat:     Pharynx: Oropharynx is clear. No oropharyngeal exudate.  Cardiovascular:     Rate and Rhythm: Normal rate and regular rhythm.     Heart sounds: No murmur heard.    No friction rub. No gallop.  Pulmonary:     Breath sounds: Normal breath sounds.  Chest:  Breasts:    Right: No swelling, bleeding, inverted nipple, mass, nipple discharge or skin change.     Left: No swelling, bleeding, inverted nipple, mass, nipple discharge or skin change.     Comments: Her left breast has healed very well from her recent surgery. Abdominal:     General: Bowel sounds are normal. There is no distension.     Palpations: Abdomen is soft. There is no mass.     Tenderness: There is no abdominal tenderness.  Musculoskeletal:        General: No tenderness.     Cervical back: Normal range of motion and neck supple.     Right lower leg: No  edema.     Left lower leg: No edema.  Lymphadenopathy:     Cervical: No cervical adenopathy.     Right cervical: No superficial, deep or posterior cervical adenopathy.    Left cervical: No superficial, deep or posterior cervical adenopathy.     Upper Body:     Right upper body: No supraclavicular or axillary adenopathy.     Left upper body: No supraclavicular or axillary adenopathy.     Lower Body: No right inguinal adenopathy. No left inguinal adenopathy.  Skin:    Coloration: Skin is not jaundiced.     Findings: No lesion or rash.  Neurological:     General: No focal deficit present.     Mental Status: She is alert and oriented to person, place, and time. Mental status is at baseline.  Psychiatric:        Mood and Affect: Mood normal.        Behavior: Behavior normal.        Thought Content: Thought content normal.        Judgment: Judgment normal.   PATHOLOGY:  Oncotype testing showed her with a recurrence score of 27, which equates to a 16% chance of her disease coming back within a decade with endocrine therapy alone.  This places her in the  high risk stratification.    ASSESSMENT & PLAN:  Kendra Collins is a 57 y.o. 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  In clinic today, I went over her Oncotype test results with her, placing her in the high risk categorization.  She understands patient was initially chemotherapy as part of her disease management.  I will place her on 4 cycles of Taxotere/Cytoxan, with each cycle being given every 3 weeks.  The patient was made aware of the side effects that go along with this chemotherapy regimen, including alopecia, neuropathy, nausea, cytopenias, and fatigue.  Neulasta will be given with each cycle of chemotherapy to prevent severe neutropenia from causing any complications.  I will also arrange for her to have a port placed through which all of her adjuvant  chemotherapy will be given.  She understands that adjuvant breast radiation and endocrine therapy will be given after she completes her chemotherapy.  Her first cycle of Taxotere/Cytoxan is tentatively scheduled for Wednesday, August 7th.  I will see her back in 3 weeks before she heads into her second cycle of adjuvant Taxotere/Cytoxan.  The patient understands all the plans discussed today and is in agreement with them.  Conception Doebler Kirby Funk, MD

## 2023-04-18 ENCOUNTER — Encounter: Payer: Self-pay | Admitting: Oncology
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
## 2023-04-18 NOTE — Progress Notes (Signed)
START ON PATHWAY REGIMEN - Breast     A cycle is every 21 days:     Cyclophosphamide      Docetaxel   **Always confirm dose/schedule in your pharmacy ordering system**  Patient Characteristics: Postoperative without Neoadjuvant Therapy, M0 (Pathologic Staging), Invasive Disease, Adjuvant Therapy, HER2 Negative, ER Positive, Node Negative, pT1a, pN35mi or pT1b-c, pN0/N28mi or pT2 or Higher, pN0, Oncotype High Risk (? 26) Therapeutic Status: Postoperative without Neoadjuvant Therapy, M0 (Pathologic Staging) AJCC Grade: G2 AJCC N Category: pN0 AJCC M Category: cM0 ER Status: Positive (+) AJCC 8 Stage Grouping: IA HER2 Status: Negative (-) Oncotype Dx Recurrence Score: 27 AJCC T Category: pT1c PR Status: Positive (+) Has this patient completed genomic testing<= Yes - Oncotype DX(R) Intent of Therapy: Curative Intent, Discussed with Patient

## 2023-04-19 ENCOUNTER — Encounter: Payer: Self-pay | Admitting: Hematology and Oncology

## 2023-04-19 ENCOUNTER — Inpatient Hospital Stay (INDEPENDENT_AMBULATORY_CARE_PROVIDER_SITE_OTHER): Payer: No Typology Code available for payment source | Admitting: Hematology and Oncology

## 2023-04-19 ENCOUNTER — Other Ambulatory Visit: Payer: Self-pay

## 2023-04-19 VITALS — BP 125/67 | HR 77 | Temp 98.2°F | Resp 18 | Ht 61.0 in | Wt 209.7 lb

## 2023-04-19 DIAGNOSIS — C50412 Malignant neoplasm of upper-outer quadrant of left female breast: Secondary | ICD-10-CM | POA: Diagnosis not present

## 2023-04-19 DIAGNOSIS — Z170 Estrogen receptor positive status [ER+]: Secondary | ICD-10-CM

## 2023-04-19 MED ORDER — LIDOCAINE-PRILOCAINE 2.5-2.5 % EX CREA: TOPICAL_CREAM | CUTANEOUS | 3 refills | Status: DC

## 2023-04-19 MED ORDER — ONDANSETRON HCL 8 MG PO TABS: 8.0000 mg | ORAL_TABLET | Freq: Three times a day (TID) | ORAL | 1 refills | Status: DC | PRN

## 2023-04-19 MED ORDER — PROCHLORPERAZINE MALEATE 10 MG PO TABS: 10.0000 mg | ORAL_TABLET | Freq: Four times a day (QID) | ORAL | 1 refills | Status: DC | PRN

## 2023-04-19 MED ORDER — DEXAMETHASONE 4 MG PO TABS: ORAL_TABLET | ORAL | 1 refills | Status: DC

## 2023-04-19 NOTE — Progress Notes (Signed)
Spoke with Jamas Lav about the [employer], she will bring in proof of income at next visit. Also sent a request to [employer] to look for any co-pay assistance available.

## 2023-04-21 ENCOUNTER — Encounter: Payer: Self-pay | Admitting: Oncology

## 2023-04-25 ENCOUNTER — Other Ambulatory Visit: Payer: Self-pay | Admitting: Pharmacist

## 2023-04-25 ENCOUNTER — Encounter: Payer: Self-pay | Admitting: Oncology

## 2023-04-25 ENCOUNTER — Inpatient Hospital Stay: Payer: No Typology Code available for payment source | Attending: Oncology

## 2023-04-25 DIAGNOSIS — Z5189 Encounter for other specified aftercare: Secondary | ICD-10-CM | POA: Insufficient documentation

## 2023-04-25 DIAGNOSIS — C50412 Malignant neoplasm of upper-outer quadrant of left female breast: Secondary | ICD-10-CM

## 2023-04-25 DIAGNOSIS — C50912 Malignant neoplasm of unspecified site of left female breast: Secondary | ICD-10-CM | POA: Insufficient documentation

## 2023-04-25 DIAGNOSIS — Z170 Estrogen receptor positive status [ER+]: Secondary | ICD-10-CM | POA: Insufficient documentation

## 2023-04-25 DIAGNOSIS — Z5111 Encounter for antineoplastic chemotherapy: Secondary | ICD-10-CM | POA: Insufficient documentation

## 2023-04-25 LAB — HEPATIC FUNCTION PANEL
ALT: 29 U/L (ref 7–35)
AST: 37 — AB (ref 13–35)
Alkaline Phosphatase: 115 (ref 25–125)
Bilirubin, Total: 0.4

## 2023-04-25 LAB — CBC AND DIFFERENTIAL
HCT: 41 (ref 36–46)
Hemoglobin: 13.9 (ref 12.0–16.0)
Neutrophils Absolute: 5.73
Platelets: 242 10*3/uL (ref 150–400)
WBC: 9.4

## 2023-04-25 LAB — HCG, SERUM, QUALITATIVE: hCG Qual: NEGATIVE

## 2023-04-25 LAB — COMPREHENSIVE METABOLIC PANEL
Albumin: 4.1 (ref 3.5–5.0)
Calcium: 9.8 (ref 8.7–10.7)

## 2023-04-25 LAB — BASIC METABOLIC PANEL
BUN: 13 (ref 4–21)
CO2: 26 — AB (ref 13–22)
Chloride: 105 (ref 99–108)
Creatinine: 0.6 (ref 0.5–1.1)
Glucose: 150
Potassium: 4.1 mEq/L (ref 3.5–5.1)
Sodium: 139 (ref 137–147)

## 2023-04-25 LAB — CBC: RBC: 5.04 (ref 3.87–5.11)

## 2023-04-25 MED FILL — Dexamethasone Sodium Phosphate Inj 100 MG/10ML: INTRAMUSCULAR | Qty: 1 | Status: AC

## 2023-04-25 NOTE — Progress Notes (Signed)
.  Pharmacist Chemotherapy Monitoring - Initial Assessment    Anticipated start date: 04/26/23  The following has been reviewed per standard work regarding the patient's treatment regimen: The patient's diagnosis, treatment plan and drug doses, and organ/hematologic function Lab orders and baseline tests specific to treatment regimen  The treatment plan start date, drug sequencing, and pre-medications Prior authorization status  Patient's documented medication list, including drug-drug interaction screen and prescriptions for anti-emetics and supportive care specific to the treatment regimen The drug concentrations, fluid compatibility, administration routes, and timing of the medications to be used The patient's access for treatment and lifetime cumulative dose history, if applicable  The patient's medication allergies and previous infusion related reactions, if applicable   Changes made to treatment plan:  N/A  Follow up needed:  N/A   Domenic Schwab, [facility], 04/25/2023  1:17 PM

## 2023-04-26 ENCOUNTER — Inpatient Hospital Stay: Payer: No Typology Code available for payment source

## 2023-04-26 VITALS — BP 124/75 | HR 71 | Temp 97.8°F | Resp 20 | Ht 61.0 in | Wt 208.2 lb

## 2023-04-26 DIAGNOSIS — Z5189 Encounter for other specified aftercare: Secondary | ICD-10-CM | POA: Diagnosis not present

## 2023-04-26 DIAGNOSIS — C50912 Malignant neoplasm of unspecified site of left female breast: Secondary | ICD-10-CM | POA: Diagnosis present

## 2023-04-26 DIAGNOSIS — Z170 Estrogen receptor positive status [ER+]: Secondary | ICD-10-CM

## 2023-04-26 DIAGNOSIS — Z5111 Encounter for antineoplastic chemotherapy: Secondary | ICD-10-CM | POA: Diagnosis present

## 2023-04-26 MED ORDER — SODIUM CHLORIDE 0.9 % IV SOLN
10.0000 mg | Freq: Once | INTRAVENOUS | Status: AC
  Administered 2023-04-26: 10 mg via INTRAVENOUS
  Filled 2023-04-26: qty 10

## 2023-04-26 MED ORDER — SODIUM CHLORIDE 0.9 % IV SOLN
75.0000 mg/m2 | Freq: Once | INTRAVENOUS | Status: AC
  Administered 2023-04-26: 160 mg via INTRAVENOUS
  Filled 2023-04-26: qty 16

## 2023-04-26 MED ORDER — HEPARIN SOD (PORK) LOCK FLUSH 100 UNIT/ML IV SOLN
500.0000 [IU] | Freq: Once | INTRAVENOUS | Status: AC | PRN
  Administered 2023-04-26: 500 [IU]

## 2023-04-26 MED ORDER — PALONOSETRON HCL INJECTION 0.25 MG/5ML
0.2500 mg | Freq: Once | INTRAVENOUS | Status: AC
  Administered 2023-04-26: 0.25 mg via INTRAVENOUS
  Filled 2023-04-26: qty 5

## 2023-04-26 MED ORDER — SODIUM CHLORIDE 0.9 % IV SOLN: Freq: Once | INTRAVENOUS | Status: AC

## 2023-04-26 MED ORDER — SODIUM CHLORIDE 0.9 % IV SOLN
600.0000 mg/m2 | Freq: Once | INTRAVENOUS | Status: AC
  Administered 2023-04-26: 1220 mg via INTRAVENOUS
  Filled 2023-04-26: qty 36

## 2023-04-26 MED ORDER — SODIUM CHLORIDE 0.9% FLUSH
10.0000 mL | INTRAVENOUS | Status: DC | PRN
  Administered 2023-04-26: 10 mL

## 2023-04-26 NOTE — Patient Instructions (Signed)
Kendra Collins  Discharge Instructions: Thank you for choosing [facility] to provide your oncology and hematology care.  If you have a lab appointment with the [facility], please go directly to the [facility] and check in at the registration area.   Wear comfortable clothing and clothing appropriate for easy access to any Portacath or PICC line.   We strive to give you quality time with your provider. You may need to reschedule your appointment if you arrive late (15 or more minutes).  Arriving late affects you and other patients whose appointments are after yours.  Also, if you miss three or more appointments without notifying the office, you may be dismissed from the clinic at the provider's discretion.      For prescription refill requests, have your pharmacy contact our office and allow 72 hours for refills to be completed.    Today you received the following chemotherapy and/or immunotherapy agents Docetaxel & Cytoxan      To help prevent nausea and vomiting after your treatment, we encourage you to take your nausea medication as directed.  BELOW ARE SYMPTOMS THAT SHOULD BE REPORTED IMMEDIATELY: *FEVER GREATER THAN 100.4 F (38 C) OR HIGHER *CHILLS OR SWEATING *NAUSEA AND VOMITING THAT IS NOT CONTROLLED WITH YOUR NAUSEA MEDICATION *UNUSUAL SHORTNESS OF BREATH *UNUSUAL BRUISING OR BLEEDING *URINARY PROBLEMS (pain or burning when urinating, or frequent urination) *BOWEL PROBLEMS (unusual diarrhea, constipation, pain near the anus) TENDERNESS IN MOUTH AND THROAT WITH OR WITHOUT PRESENCE OF ULCERS (sore throat, sores in mouth, or a toothache) UNUSUAL RASH, SWELLING OR PAIN  UNUSUAL VAGINAL DISCHARGE OR ITCHING   Items with * indicate a potential emergency and should be followed up as soon as possible or go to the Emergency Department if any problems should occur.  Please show the CHEMOTHERAPY ALERT CARD or IMMUNOTHERAPY ALERT CARD at check-in  to the Emergency Department and triage nurse.  Should you have questions after your visit or need to cancel or reschedule your appointment, please contact [facility]  Dept: [phone]  and follow the prompts.  Office hours are 8:00 a.m. to 4:30 p.m. Monday - Friday. Please note that voicemails left after 4:00 p.m. may not be returned until the following business day.  We are closed weekends and major holidays. You have access to a nurse at all times for urgent questions. Please call the main number to the clinic Dept: [phone] and follow the prompts.  For any non-urgent questions, you may also contact your provider using MyChart. We now offer e-Visits for anyone 75 and older to request care online for non-urgent symptoms. For details visit [email or web address].   Also download the MyChart app! Go to the app store, search "MyChart", open the app, select [facility], and log in with your MyChart username and password.

## 2023-04-28 ENCOUNTER — Inpatient Hospital Stay: Payer: No Typology Code available for payment source

## 2023-04-28 ENCOUNTER — Telehealth: Payer: Self-pay

## 2023-04-28 VITALS — BP 115/73 | HR 87 | Temp 98.2°F | Resp 18 | Ht 61.0 in | Wt 213.2 lb

## 2023-04-28 DIAGNOSIS — Z5111 Encounter for antineoplastic chemotherapy: Secondary | ICD-10-CM | POA: Diagnosis not present

## 2023-04-28 DIAGNOSIS — Z170 Estrogen receptor positive status [ER+]: Secondary | ICD-10-CM

## 2023-04-28 MED ORDER — PEGFILGRASTIM-CBQV 6 MG/0.6ML ~~LOC~~ SOSY
6.0000 mg | PREFILLED_SYRINGE | Freq: Once | SUBCUTANEOUS | Status: AC
  Administered 2023-04-28: 6 mg via SUBCUTANEOUS
  Filled 2023-04-28: qty 0.6

## 2023-04-28 NOTE — Progress Notes (Signed)
1256 PATIENT IS HERE TODAY FOR UDENYCA SHOT. THE PATIENT INFORMED us THAT HER LEFT BREAST HAS A RASH AFTER HER TREATMENT OF TAXOTERE - CYTOXAN ON 04/26/23. THE RASH LOOKED LIKE A RADIATION FLARE. IT WAS NOT ITCHY BUT IT DID CAUSE SOME PAIN. IT WAS LOCATED AROUND HER NIPPLE ABOUT 10 CM OF SKIN WAS AFFECTED. SUE PHY, PHARMD WAS INFORMED. THE PATIENT UNDERSTANDS TO CALL us IMMEDIATELY IF THE SIZE OF THE RASH INCREASES OR IF IT DOES NOT GO AWAY. THE PATIENT VERBALIZED UNDERSTANDING. THE PATIENT WAS ALSO INFORMED TO TAKE HER CLARITIN FOR 4 DAYS, AFTER THE UDENYCA SHOT. IF SHE HAS BONE PAIN NOT RELIEVED BY TAKING THE CLARITIN SHE KNOWS TO CALL us. PATIENT VERBALIZED UNDERSTANDING OF ALL OF THE ABOVE INFORMATION.Marland Kitchen

## 2023-04-28 NOTE — Telephone Encounter (Signed)
I spoke with pt. She is doing pretty well. She experienced some flushing of face, neck & breast the evening after chemo. I told her that was most likely the steroid, she agreed. She denies emesis, mouth sores, skin rash/itching, diarrhea, fever and chills. She did have intermittent nausea, relieved with meds. She noticed taste changes "everything has no flavor" per pt. She also noticed some fatigue when walking outside, achiness is better. Pt aware to start Claritin 10mg  po every day for 5 days, starting today after receiving her pegfilgrastim injection.

## 2023-04-28 NOTE — Patient Instructions (Signed)

## 2023-05-02 ENCOUNTER — Telehealth: Payer: Self-pay

## 2023-05-02 MED ORDER — NYSTATIN 100000 UNIT/ML MT SUSP
5.0000 mL | Freq: Four times a day (QID) | ORAL | 0 refills | Status: DC
Start: 2023-05-02 — End: 2023-11-21

## 2023-05-02 NOTE — Telephone Encounter (Signed)
Pt reports that she is still feeling achy, esp her low back. "Pain felt like contractions when having baby". Also, her tongue is white, no mouth sores however. She has tried using baking soda, hasn't helped per pt. Rash that was on her left breast has resolved. She denies N/V, diarrhea, chills and temp (99.6 last couple days). She mentions that her temp was 101 once, but she had several blankets on her. I notified Dr Melvyn Neth, & Darl Pikes P,RPH, of above.    05/02/2023 Kendra Collins, sent in prescription for MMW to [facility].

## 2023-05-03 ENCOUNTER — Encounter: Payer: Self-pay | Admitting: Oncology

## 2023-05-14 NOTE — Progress Notes (Unsigned)
[facility]  [address] [place],  [place]  [address] [phone]  Clinic Day:  05/15/2023  Referring physician: Weston Settle, MD   HISTORY OF PRESENT ILLNESS:  Kendra Collins is a 57 y.o. 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   Due to having a high Oncotype score, the patient is receiving adjuvant Taxotere/Cytoxan chemotherapy.  She comes in today to be evaluated before heading into her second cycle of treatment.  The patient claims to have tolerated her first cycle relatively well.  There were a few days of weakness and nausea which eventually dissipated over time.  She also had a few days of back pain, which she attributed to her Neulasta injection.  Otherwise, the patient is doing well and is willing to proceed with her second cycle of adjuvant chemotherapy.  PHYSICAL EXAM:  Blood pressure (!) 148/72, pulse 67, temperature 98.1 F (36.7 C), resp. rate 14, height 5\' 1"  (1.549 m), weight 208 lb (94.3 kg), SpO2 97%. Wt Readings from Last 3 Encounters:  05/15/23 208 lb (94.3 kg)  04/28/23 213 lb 4 oz (96.7 kg)  04/26/23 208 lb 4 oz (94.5 kg)   Body mass index is 39.3 kg/m. Performance status (ECOG): 0 - Asymptomatic Physical Exam Constitutional:      Appearance: Normal appearance.  HENT:     Mouth/Throat:     Pharynx: Oropharynx is clear. No oropharyngeal exudate.  Cardiovascular:     Rate and Rhythm: Normal rate and regular rhythm.     Heart sounds: No murmur heard.    No friction rub. No gallop.  Pulmonary:     Breath sounds: Normal breath sounds.  Chest:  Breasts:    Right: No swelling, bleeding, inverted nipple, mass, nipple discharge or skin change.     Left: No swelling, bleeding, inverted nipple, mass, nipple discharge or skin change.     Comments: Her left breast has healed very well from her recent  surgery. Abdominal:     General: Bowel sounds are normal. There is no distension.     Palpations: Abdomen is soft. There is no mass.     Tenderness: There is no abdominal tenderness.  Musculoskeletal:        General: No tenderness.     Cervical back: Normal range of motion and neck supple.     Right lower leg: No edema.     Left lower leg: No edema.  Lymphadenopathy:     Cervical: No cervical adenopathy.     Right cervical: No superficial, deep or posterior cervical adenopathy.    Left cervical: No superficial, deep or posterior cervical adenopathy.     Upper Body:     Right upper body: No supraclavicular or axillary adenopathy.     Left upper body: No supraclavicular or axillary adenopathy.     Lower Body: No right inguinal adenopathy. No left inguinal adenopathy.  Skin:    Coloration: Skin is not jaundiced.     Findings: No lesion or rash.  Neurological:     General: No focal deficit present.     Mental Status: She is alert and oriented to person, place, and time. Mental status is at baseline.  Psychiatric:        Mood and Affect: Mood normal.        Behavior: Behavior normal.  Thought Content: Thought content normal.        Judgment: Judgment normal.   PATHOLOGY:  Oncotype testing showed her with a recurrence score of 27, which equates to a 16% chance of her disease coming back within a decade with endocrine therapy alone.  This places her in the high risk stratification.    LABS:  Latest Reference Range & Units 05/15/23 09:49  Sodium 135 - 145 mmol/L 142  Potassium 3.5 - 5.1 mmol/L 3.9  Chloride 98 - 111 mmol/L 104  CO2 22 - 32 mmol/L 26  Glucose 70 - 99 mg/dL 88  BUN 6 - 20 mg/dL 10  Creatinine 4.09 - 8.11 mg/dL 9.14  Calcium 8.9 - 78.2 mg/dL 9.3  Anion gap 5 - 15  12  Alkaline Phosphatase 38 - 126 U/L 91  Albumin 3.5 - 5.0 g/dL 3.5  AST 15 - 41 U/L 19  ALT 0 - 44 U/L 22  Total Protein 6.5 - 8.1 g/dL 6.8  Total Bilirubin 0.3 - 1.2 mg/dL 0.2 (L)  GFR, Est  Non African American >60 mL/min >60  WBC 4.0 - 10.5 K/uL 5.8  RBC 3.87 - 5.11 MIL/uL 4.60  Hemoglobin 12.0 - 15.0 g/dL 95.6  HCT 21.3 - 08.6 % 38.4  MCV 80.0 - 100.0 fL 83.5  MCH 26.0 - 34.0 pg 26.7  MCHC 30.0 - 36.0 g/dL 57.8  RDW 46.9 - 62.9 % 13.9  Platelets 150 - 400 K/uL 343  nRBC 0.0 - 0.2 % 0.0  Neutrophils % 57  Lymphocytes % 32  Monocytes Relative % 9  Eosinophil % 1  Basophil % 1  Immature Granulocytes % 0  NEUT# 1.7 - 7.7 K/uL 3.3  (L): Data is abnormally low   ASSESSMENT & PLAN:  Kendra Collins is a 57 y.o. 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  She will proceed with her second cycle of Taxotere/Cytoxan this week, with each cycle being given every 3 weeks.  She will continue to receive Neulasta with each cycle of treatment to prevent severe neutropenia from causing any complications.  The patient knows she can use either Tylenol and ibuprofen as needed for bone pain.  She also knows to use the antinausea medicine on an as-needed basis.  Overall, she appears to be doing well.  I will see her back in 3 weeks before she heads into her 3rd cycle of adjuvant Taxotere/Cytoxan.  The patient understands all the plans discussed today and is in agreement with them.  Kamarah Bilotta Kirby Funk, MD

## 2023-05-15 ENCOUNTER — Inpatient Hospital Stay: Payer: No Typology Code available for payment source

## 2023-05-15 ENCOUNTER — Inpatient Hospital Stay (INDEPENDENT_AMBULATORY_CARE_PROVIDER_SITE_OTHER): Payer: No Typology Code available for payment source | Admitting: Oncology

## 2023-05-15 DIAGNOSIS — C50412 Malignant neoplasm of upper-outer quadrant of left female breast: Secondary | ICD-10-CM

## 2023-05-15 DIAGNOSIS — Z170 Estrogen receptor positive status [ER+]: Secondary | ICD-10-CM | POA: Diagnosis not present

## 2023-05-15 DIAGNOSIS — Z5111 Encounter for antineoplastic chemotherapy: Secondary | ICD-10-CM | POA: Diagnosis not present

## 2023-05-15 LAB — CBC WITH DIFFERENTIAL (CANCER CENTER ONLY)
Abs Immature Granulocytes: 0.02 10*3/uL (ref 0.00–0.07)
Basophils Absolute: 0.1 10*3/uL (ref 0.0–0.1)
Basophils Relative: 1 %
Eosinophils Absolute: 0 10*3/uL (ref 0.0–0.5)
Eosinophils Relative: 1 %
HCT: 38.4 % (ref 36.0–46.0)
Hemoglobin: 12.3 g/dL (ref 12.0–15.0)
Immature Granulocytes: 0 %
Lymphocytes Relative: 32 %
Lymphs Abs: 1.9 10*3/uL (ref 0.7–4.0)
MCH: 26.7 pg (ref 26.0–34.0)
MCHC: 32 g/dL (ref 30.0–36.0)
MCV: 83.5 fL (ref 80.0–100.0)
Monocytes Absolute: 0.5 10*3/uL (ref 0.1–1.0)
Monocytes Relative: 9 %
Neutro Abs: 3.3 10*3/uL (ref 1.7–7.7)
Neutrophils Relative %: 57 %
Platelet Count: 343 10*3/uL (ref 150–400)
RBC: 4.6 MIL/uL (ref 3.87–5.11)
RDW: 13.9 % (ref 11.5–15.5)
WBC Count: 5.8 10*3/uL (ref 4.0–10.5)
nRBC: 0 % (ref 0.0–0.2)

## 2023-05-15 LAB — CMP (CANCER CENTER ONLY)
ALT: 22 U/L (ref 0–44)
AST: 19 U/L (ref 15–41)
Albumin: 3.5 g/dL (ref 3.5–5.0)
Alkaline Phosphatase: 91 U/L (ref 38–126)
Anion gap: 12 (ref 5–15)
BUN: 10 mg/dL (ref 6–20)
CO2: 26 mmol/L (ref 22–32)
Calcium: 9.3 mg/dL (ref 8.9–10.3)
Chloride: 104 mmol/L (ref 98–111)
Creatinine: 0.51 mg/dL (ref 0.44–1.00)
GFR, Estimated: >60 mL/min (ref >60)
Glucose, Bld: 88 mg/dL (ref 70–99)
Potassium: 3.9 mmol/L (ref 3.5–5.1)
Sodium: 142 mmol/L (ref 135–145)
Total Bilirubin: 0.2 mg/dL — ABNORMAL LOW (ref 0.3–1.2)
Total Protein: 6.8 g/dL (ref 6.5–8.1)

## 2023-05-15 LAB — HCG, SERUM, QUALITATIVE: Preg, Serum: NEGATIVE

## 2023-05-15 NOTE — Progress Notes (Signed)
[facility] to face [facility] in [facility]. Pt reports that she had had her chemo treatment . S/he reports that the first week after treatment was rough. She developed an UTI and is now completing her antibiotics. She is here today to follow up with Dr. Melvyn Neth. Encouraged to call with questions or concerns. Discussed that her resistance is lower when on chemo so she should always call if she develops a fever.

## 2023-05-16 MED FILL — Docetaxel Soln for IV Infusion 160 MG/16ML: INTRAVENOUS | Qty: 16 | Status: AC

## 2023-05-16 MED FILL — Dexamethasone Sodium Phosphate Inj 100 MG/10ML: INTRAMUSCULAR | Qty: 1 | Status: AC

## 2023-05-16 MED FILL — Cyclophosphamide For Inj 500 MG: INTRAMUSCULAR | Qty: 61 | Status: AC

## 2023-05-17 ENCOUNTER — Inpatient Hospital Stay: Payer: No Typology Code available for payment source

## 2023-05-17 VITALS — BP 145/76 | HR 97 | Temp 97.7°F | Resp 15 | Wt 209.1 lb

## 2023-05-17 DIAGNOSIS — Z170 Estrogen receptor positive status [ER+]: Secondary | ICD-10-CM

## 2023-05-17 DIAGNOSIS — Z5111 Encounter for antineoplastic chemotherapy: Secondary | ICD-10-CM | POA: Diagnosis not present

## 2023-05-17 MED ORDER — SODIUM CHLORIDE 0.9 % IV SOLN
600.0000 mg/m2 | Freq: Once | INTRAVENOUS | Status: AC
  Administered 2023-05-17: 1220 mg via INTRAVENOUS
  Filled 2023-05-17: qty 50

## 2023-05-17 MED ORDER — HEPARIN SOD (PORK) LOCK FLUSH 100 UNIT/ML IV SOLN
500.0000 [IU] | Freq: Once | INTRAVENOUS | Status: AC | PRN
  Administered 2023-05-17: 500 [IU]

## 2023-05-17 MED ORDER — PALONOSETRON HCL INJECTION 0.25 MG/5ML
0.2500 mg | Freq: Once | INTRAVENOUS | Status: AC
  Administered 2023-05-17: 0.25 mg via INTRAVENOUS
  Filled 2023-05-17: qty 5

## 2023-05-17 MED ORDER — SODIUM CHLORIDE 0.9% FLUSH
10.0000 mL | INTRAVENOUS | Status: DC | PRN
  Administered 2023-05-17: 10 mL

## 2023-05-17 MED ORDER — SODIUM CHLORIDE 0.9 % IV SOLN
75.0000 mg/m2 | Freq: Once | INTRAVENOUS | Status: AC
  Administered 2023-05-17: 160 mg via INTRAVENOUS
  Filled 2023-05-17: qty 16

## 2023-05-17 MED ORDER — SODIUM CHLORIDE 0.9 % IV SOLN
10.0000 mg | Freq: Once | INTRAVENOUS | Status: AC
  Administered 2023-05-17: 10 mg via INTRAVENOUS
  Filled 2023-05-17: qty 10

## 2023-05-17 MED ORDER — SODIUM CHLORIDE 0.9 % IV SOLN: Freq: Once | INTRAVENOUS | Status: AC

## 2023-05-17 NOTE — Patient Instructions (Signed)
Cyclophosphamide Injection Qu es este medicamento? La CICLOFOSFAMIDA trata algunos tipos de cncer. Acta desacelerando el crecimiento de clulas cancerosas. Este medicamento puede ser utilizado para otros usos; si tiene alguna pregunta consulte con su proveedor de atencin mdica o con su farmacutico. MARCAS COMUNES: Cyclophosphamide, Cytoxan, Neosar Qu le debo informar a mi profesional de la salud antes de tomar este medicamento? Necesitan saber si usted presenta alguno de los siguientes problemas o situaciones: Enfermedad cardiaca Ritmo cardiaco o frecuencia cardiaca irregular Infeccin Problemas renales Enfermedad heptica Niveles bajos de clulas sanguneas (glbulos blancos, plaquetas o glbulos rojos) Enfermedad pulmonar Radioterapia previa Dificultad para orinar Una reaccin alrgica o inusual a la ciclofosfamida, a otros medicamentos, alimentos, colorantes o conservantes Si est embarazada o buscando quedar embarazada Si est amamantando a un beb Cmo debo utilizar este medicamento? Este medicamento se inyecta en una vena. Su equipo de atencin lo [employer] en un hospital o en un entorno clnico. Hable con su equipo de atencin sobre el uso de este medicamento en nios. Puede requerir atencin especial. Sobredosis: Pngase en contacto inmediatamente con un centro toxicolgico o una sala de urgencia si usted cree que haya tomado demasiado medicamento.<br>ATENCIN: [employer] es solo para usted. No comparta este medicamento con nadie. Qu sucede si me olvido de una dosis? Cumpla con las citas para dosis de seguimiento. Es importante no olvidar ninguna dosis. Llame a su equipo de atencin si no puede asistir a una cita. Qu puede interactuar con este medicamento? Anfotericina B Amiodarona Azatioprina Ciertos medicamentos antivirales para VIH o hepatitis Ciertos medicamentos para la presin sangunea, tales como enalapril, lisinopril,  quinapril Ciclosporina Diurticos Etanercept Indometacina Medicamentos para relajar los msculos Metronidazol Natalizumab Tamoxifeno Warfarina Puede ser que esta lista no menciona todas las posibles interacciones. Informe a su profesional de [employer] de [employer] productos a base de hierbas, medicamentos de [place] o suplementos nutritivos que est tomando. Si usted fuma, consume bebidas alcohlicas o si utiliza drogas ilegales, indqueselo tambin a su profesional de [employer]. Algunas sustancias pueden interactuar con su medicamento. A qu debo estar atento al usar [employer]? Este medicamento podra hacerle sentir un Risk analyst. Esto no es inusual, ya que la quimioterapia puede afectar tanto a las clulas sanas como a las clulas cancerosas. Si presenta algn efecto secundario, infrmelo. Contine con el tratamiento incluso si se siente enfermo, a menos que su equipo de [employer] lo suspenda. Usted podra necesitar realizarse [employer] de sangre mientras est usando [place]. Este medicamento puede aumentar su riesgo de contraer una infeccin. Llame para pedir consejo a su equipo de atencin si tiene fiebre, escalofros, dolor de garganta o cualquier otro sntoma de resfriado o gripe. No se trate usted mismo. Trate de no acercarse a personas que estn enfermas. Evite usar medicamentos que contengan aspirina, acetaminofeno, ibuprofeno, naproxeno o ketoprofeno, a menos que as lo indique su equipo de atencin. Estos medicamentos pueden ocultar la fiebre. Proceda con cuidado al cepillar sus dientes, usar hilo dental o Chemical engineer palillos para los dientes, ya que podra contraer una infeccin o Geophysicist/field seismologist con mayor facilidad. Si recibe algn tratamiento dental, informe a su dentista que est [employer]. Beba agua u otros lquidos segn lo indicado. Orine con frecuencia, incluso de noche. Algunos productos pueden contener alcohol. Pregunte a su equipo de  atencin si este medicamento contiene alcohol. Asegrese de informar a todos los equipos de atencin que est usando este medicamento. Ciertos medicamentos, como metronidazol y disulfiram, pueden causar una reaccin desagradable cuando  se usan con alcohol. Esta reaccin incluye enrojecimiento, dolor de cabeza, nuseas, vmitos, sudoracin y aumento de la sed. La reaccin puede durar de 30 minutos a varias horas. Informe a su equipo de atencin si est buscando un embarazo o si cree que podra estar embarazada. Este medicamento puede causar defectos congnitos graves si se [place] durante el embarazo y por 1 ao despus de la ltima dosis. Se requiere una prueba de embarazo con resultado negativo antes de Games developer a Producer, television/film/video. Se recomienda Chemical engineer un mtodo anticonceptivo confiable mientras est usando este medicamento y durante 1 ao despus de la ltima dosis. Hable con su equipo de atencin [employer] mtodos anticonceptivos confiables. No embarace a Careers information officer est usando este medicamento y durante 4 meses despus de la ltima dosis. Use un condn [employer]. No debe amamantar a un beb mientras est usando este medicamento o por 1 semana despus de la ltima dosis. Este medicamento podra causar infertilidad. Hable con su equipo de atencin si le preocupa su fertilidad. Hable con su equipo de atencin sobre su riesgo de cncer. Usted podra tener mayor riesgo de desarrollar ciertos tipos de cncer si [place] este medicamento. Qu efectos secundarios puedo tener al [employer] este medicamento? Efectos secundarios que debe informar a su equipo de atencin tan pronto como sea posible: Reacciones alrgicas: erupcin cutnea, comezn/picazn, urticaria, hinchazn de la cara, los labios, la lengua o la garganta Tos seca, falta de aire o problemas para respirar Insuficiencia cardiaca: falta de aire, hinchazn de los tobillos, los pies o las manos, aumento de peso repentino, debilidad o  fatiga inusuales Inflamacin del msculo cardiaco: debilidad o fatiga inusuales, falta de aire, dolor en el pecho, frecuencia cardaca rpida o irregular, mareos, hinchazn de los tobillos, los pies o las manos. Cambios en el ritmo cardiaco: frecuencia cardiaca rpida o irregular, mareos, sensacin de desmayo o aturdimiento, Journalist, newspaper, dificultad para respirar Infeccin: fiebre, escalofros, tos, dolor de garganta, heridas que no sanan, dolor o problemas para Geographical information systems officer, sensacin general de molestia o malestar Lesin en los riones: disminucin en la cantidad de orina, hinchazn de los tobillos, las manos o los pies Lesin en el hgado: Engineer, mining en la regin abdominal superior derecha, prdida de apetito, nuseas, heces de color claro, orina amarilla oscura o marrn, color amarillento de los ojos o la piel, debilidad o fatiga inusuales Recuento bajo de glbulos rojos: debilidad o fatiga inusuales, mareo, dolor de [place], dificultad para respirar Nivel bajo de sodio: debilidad muscular, fatiga, mareos, dolor de cabeza, confusin Orina roja o marrn oscuro Sangrado o moretones inusuales Efectos secundarios que generalmente no requieren atencin mdica (debe informarlos a su equipo de atencin si persisten o si son molestos): Cada del cabello Ciclos menstruales irregulares o sangrado ligero entre periodos menstruales Prdida del apetito Therapist, sports, enrojecimiento o hinchazn con llagas dentro de la boca o la garganta Vmito Puede ser que esta lista no menciona todos los posibles efectos secundarios. Comunquese a su mdico por asesoramiento mdico [employer]. Usted puede informar los efectos secundarios a la FDA por telfono al 1-800-FDA-1088. Dnde debo guardar mi medicina? Este medicamento se administra en hospitales o clnicas. No se guarda en su casa. <b>ATENCIN: Este folleto es un resumen. Puede ser que no cubra toda la posible informacin. Si usted tiene preguntas  acerca de esta medicina, consulte con su mdico, su farmacutico o su profesional de Radiographer, therapeutic.</b>  2024 Elsevier/Gold Standard (2022-07-27 00:00:00) [place] es este medicamento? El PACLITAXEL trata algunos  tipos de cncer. Acta desacelerando el crecimiento de clulas cancerosas. Este medicamento puede ser utilizado para otros usos; si tiene alguna pregunta consulte con su proveedor de atencin mdica o con su farmacutico. MARCAS COMUNES: Onxol, Taxol Qu le debo informar a mi profesional de la salud antes de tomar este medicamento? Necesitan saber si usted presenta alguno de los siguientes problemas o situaciones: Enfermedad cardiaca Enfermedad heptica Niveles bajos de glbulos blancos Una reaccin alrgica o inusual al paclitaxel, a otros medicamentos, alimentos, colorantes o conservantes Si usted o su pareja est embarazada o intentando quedar embarazada Si est amamantando a un beb Cmo debo Visual merchandiser medicamento? Este medicamento se inyecta en una vena. Su equipo de atencin lo [employer] en un hospital o en un entorno clnico. Hable con su equipo de atencin sobre el uso de este medicamento en nios. Aunque este medicamento se puede administrar a nios con ciertas afecciones, existen precauciones que deben tomarse. Sobredosis: Pngase en contacto inmediatamente con un centro toxicolgico o una sala de urgencia si usted cree que haya tomado demasiado medicamento.<br>ATENCIN: [employer] es solo para usted. No comparta este medicamento con nadie. Qu sucede si me olvido de una dosis? Cumpla con las citas para dosis de seguimiento. Es importante no olvidar ninguna dosis. Llame a su equipo de atencin si no puede asistir a una cita. Qu puede interactuar con este medicamento? No use este medicamento con ninguno de los siguientes productos: Vacunas de virus vivos Otros medicamentos podran afectar la forma en que funciona este medicamento. Hable con su equipo de atencin sobre  todos los medicamentos que [place]. Es posible que Engineer, civil (consulting) en su plan de tratamiento para reducir el riesgo de efectos secundarios y asegurarse de que los medicamentos actan del modo previsto. Puede ser que esta lista no menciona todas las posibles interacciones. Informe a su profesional de [employer] de [employer] productos a base de hierbas, medicamentos de [place] o suplementos nutritivos que est tomando. Si usted fuma, consume bebidas alcohlicas o si utiliza drogas ilegales, indqueselo tambin a su profesional de [employer]. Algunas sustancias pueden interactuar con su medicamento. A qu debo estar atento al usar [employer]? Se supervisar su estado de salud atentamente mientras reciba este medicamento. Usted podra necesitar realizarse [employer] de sangre mientras est usando [place]. Este medicamento podra hacerle sentir un Risk analyst. Esto no es inusual, ya que la quimioterapia puede afectar tanto a las clulas sanas como a las clulas cancerosas. Si presenta algn efecto secundario, infrmelo. Contine con el tratamiento incluso si se siente enfermo, a menos que su equipo de [employer] lo suspenda. Este medicamento puede causar Therapist, art graves. Para reducir Nurse, adult, su equipo de atencin puede darle otros medicamentos que deber usar antes de Regulatory affairs officer. Asegrese de seguir las instrucciones de su equipo de atencin. Este medicamento puede aumentar su riesgo de contraer una infeccin. Llame para pedir consejo a su equipo de atencin si tiene fiebre, escalofros, dolor de garganta o cualquier otro sntoma de resfriado o gripe. No se trate usted mismo. Trate de no acercarse a personas que estn enfermas. Este medicamento podra aumentar el riesgo de moretones o sangrado. Llame a su equipo de atencin si observa sangrados inusuales. Proceda con cuidado al cepillar sus dientes, usar hilo dental o Chemical engineer palillos para los dientes, ya que  podra contraer una infeccin o Geophysicist/field seismologist con mayor facilidad. Si recibe algn tratamiento dental, informe a su dentista que est [employer]. Hable con su equipo  de atencin si podra estar embarazada. Este medicamento puede causar defectos congnitos graves si se [place] durante el [place]. Hable con su equipo de atencin antes de amamantar. Es posible que sea Passenger transport manager cambios en su plan de tratamiento. Qu efectos secundarios puedo tener al [employer] este medicamento? Efectos secundarios que debe informar a su equipo de atencin tan pronto como sea posible: Reacciones alrgicas: erupcin cutnea, comezn/picazn, urticaria, hinchazn de la cara, los labios, la lengua o la garganta Cambios en el ritmo cardiaco: frecuencia cardiaca rpida o irregular, mareos, sensacin de desmayo o aturdimiento, Journalist, newspaper, dificultad para respirar Aumento de la presin arterial Infeccin: fiebre, escalofros, tos, dolor de garganta, heridas que no sanan, dolor o problemas para Geographical information systems officer, sensacin general de Engineer, structural Presin arterial baja: mareo, sensacin de desmayo o aturdimiento, visin borrosa Recuento bajo de glbulos rojos: debilidad o fatiga inusuales, mareo, dolor de cabeza, dificultad para respirar Hinchazn dolorosa, calor o enrojecimiento de la piel, ampollas o llagas en el sitio de la infusin Dolor, hormigueo o entumecimiento de las manos o los pies Frecuencia cardiaca lenta: mareos, sensacin de desmayo o aturdimiento, confusin, problemas para respirar, debilidad o fatiga inusuales Sangrado o moretones inusuales Efectos secundarios que generalmente no requieren atencin mdica (debe informarlos a su equipo de atencin si persisten o si son molestos): Diarrea Cada del Water engineer en las articulaciones Prdida del apetito Dolor muscular Nuseas Vmito Puede ser que esta lista no menciona todos los posibles efectos secundarios. Comunquese a su mdico por  asesoramiento mdico [employer]. Usted puede informar los efectos secundarios a la FDA por telfono al 1-800-FDA-1088. Dnde debo guardar mi medicina? Este medicamento se administra en hospitales o clnicas. No se guarda en su casa. <b>ATENCIN: Este folleto es un resumen. Puede ser que no cubra toda la posible informacin. Si usted tiene preguntas acerca de esta medicina, consulte con su mdico, su farmacutico o su profesional de Radiographer, therapeutic.</b>  2024 Elsevier/Gold Standard (2022-07-27 00:00:00)

## 2023-05-19 ENCOUNTER — Inpatient Hospital Stay: Payer: No Typology Code available for payment source

## 2023-05-19 VITALS — BP 132/73 | HR 90 | Temp 97.7°F | Resp 16 | Wt 213.0 lb

## 2023-05-19 DIAGNOSIS — Z5111 Encounter for antineoplastic chemotherapy: Secondary | ICD-10-CM | POA: Diagnosis not present

## 2023-05-19 DIAGNOSIS — C50412 Malignant neoplasm of upper-outer quadrant of left female breast: Secondary | ICD-10-CM

## 2023-05-19 MED ORDER — PEGFILGRASTIM-CBQV 6 MG/0.6ML ~~LOC~~ SOSY
6.0000 mg | PREFILLED_SYRINGE | Freq: Once | SUBCUTANEOUS | Status: AC
  Administered 2023-05-19: 6 mg via SUBCUTANEOUS
  Filled 2023-05-19: qty 0.6

## 2023-05-19 NOTE — Patient Instructions (Signed)

## 2023-06-04 NOTE — Progress Notes (Unsigned)
[facility]  [address] [place],  [place]  [address] [phone]  Clinic Day:  06/05/2023  Referring physician: Weston Settle, MD   HISTORY OF PRESENT ILLNESS:  Kendra Collins is a 57 y.o. female withstage IA (T1c N0 M0) hormone positive breast cancer, status post a left breast lumpectomy in June 2024.   Due to having a high Oncotype score, the patient is receiving adjuvant Taxotere/Cytoxan chemotherapy.  She comes in today to be evaluated before heading into her 3rd cycle of treatment.  The patient claims to have tolerated her 2nd cycle relatively well.   She has a few days of weakness and nausea that eventually dissipate over time.  She also claims to feel cold for a few days.  Otherwise, the patient is doing well.  She is willing to proceed with her 3rd cycle of adjuvant chemotherapy.  PHYSICAL EXAM:  Blood pressure (!) 166/85, pulse (!) 102, temperature (!) 97.5 F (36.4 C), resp. rate 16, height 5\' 1"  (1.549 m), weight 209 lb 1.6 oz (94.8 kg), SpO2 96%. Wt Readings from Last 3 Encounters:  06/05/23 209 lb 1.6 oz (94.8 kg)  05/19/23 213 lb (96.6 kg)  05/17/23 209 lb 1.9 oz (94.9 kg)   Body mass index is 39.51 kg/m. Performance status (ECOG): 0 - Asymptomatic Physical Exam Constitutional:      Appearance: Normal appearance.  HENT:     Mouth/Throat:     Pharynx: Oropharynx is clear. No oropharyngeal exudate.  Cardiovascular:     Rate and Rhythm: Normal rate and regular rhythm.     Heart sounds: No murmur heard.    No friction rub. No gallop.  Pulmonary:     Breath sounds: Normal breath sounds.  Chest:  Breasts:    Right: No swelling, bleeding, inverted nipple, mass, nipple discharge or skin change.     Left: No swelling, bleeding, inverted nipple, mass, nipple discharge or skin change.     Comments: Her left breast has healed very well from her recent surgery. Abdominal:     General: Bowel sounds are normal. There is no distension.      Palpations: Abdomen is soft. There is no mass.     Tenderness: There is no abdominal tenderness.  Musculoskeletal:        General: No tenderness.     Cervical back: Normal range of motion and neck supple.     Right lower leg: No edema.     Left lower leg: No edema.  Lymphadenopathy:     Cervical: No cervical adenopathy.     Right cervical: No superficial, deep or posterior cervical adenopathy.    Left cervical: No superficial, deep or posterior cervical adenopathy.     Upper Body:     Right upper body: No supraclavicular or axillary adenopathy.     Left upper body: No supraclavicular or axillary adenopathy.     Lower Body: No right inguinal adenopathy. No left inguinal adenopathy.  Skin:    Coloration: Skin is not jaundiced.     Findings: No lesion or rash.  Neurological:     General: No focal deficit present.     Mental Status: She is alert and oriented to person, place, and time. Mental status is at baseline.  Psychiatric:        Mood and Affect: Mood normal.        Behavior: Behavior normal.        Thought Content: Thought content normal.  Judgment: Judgment normal.    LABS:  Latest Reference Range & Units 06/05/23 09:59  Sodium 135 - 145 mmol/L 140  Potassium 3.5 - 5.1 mmol/L 3.7  Chloride 98 - 111 mmol/L 106  CO2 22 - 32 mmol/L 24  Glucose 70 - 99 mg/dL 098 (H)  BUN 6 - 20 mg/dL 11  Creatinine 1.19 - 1.47 mg/dL 8.29  Calcium 8.9 - 56.2 mg/dL 9.0  Anion gap 5 - 15  10  Alkaline Phosphatase 38 - 126 U/L 101  Albumin 3.5 - 5.0 g/dL 3.5  AST 15 - 41 U/L 21  ALT 0 - 44 U/L 21  Total Protein 6.5 - 8.1 g/dL 6.9  Total Bilirubin 0.3 - 1.2 mg/dL 0.5  GFR, Est Non African American >60 mL/min >60  WBC 4.0 - 10.5 K/uL 7.1  RBC 3.87 - 5.11 MIL/uL 4.35  Hemoglobin 12.0 - 15.0 g/dL 13.0  HCT 86.5 - 78.4 % 36.9  MCV 80.0 - 100.0 fL 84.8  MCH 26.0 - 34.0 pg 27.6  MCHC 30.0 - 36.0 g/dL 69.6  RDW 29.5 - 28.4 % 14.9  Platelets 150 - 400 K/uL 324  nRBC 0.0 - 0.2 %  0.0  Neutrophils % 63  Lymphocytes % 27  Monocytes Relative % 8  Eosinophil % 0  Basophil % 1  Immature Granulocytes % 1  NEUT# 1.7 - 7.7 K/uL 4.5  Lymphocyte # 0.7 - 4.0 K/uL 1.9  Monocyte # 0.1 - 1.0 K/uL 0.6  Eosinophils Absolute 0.0 - 0.5 K/uL 0.0  Basophils Absolute 0.0 - 0.1 K/uL 0.1  Abs Immature Granulocytes 0.00 - 0.07 K/uL 0.05  Preg Test, Ur NEGATIVE  NEGATIVE  (H): Data is abnormally high  ASSESSMENT & PLAN:  Kendra Collins is a 57 y.o. 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  She will proceed with her 3rd cycle of Taxotere/Cytoxan this week, with each cycle being given every 3 weeks.  She will continue to receive Neulasta with each cycle of treatment to prevent severe neutropenia from causing any complications.  The patient knows she can use either Tylenol and ibuprofen as needed for bone pain.  She also knows to use the antinausea medicine on an as-needed basis.  Overall, she appears to be doing well.  I will see her back in 3 weeks before she heads into her 4th and final cycle of adjuvant Taxotere/Cytoxan.  The patient understands all the plans discussed today and is in agreement with them.  Damarious Holtsclaw Kirby Funk, MD

## 2023-06-05 ENCOUNTER — Inpatient Hospital Stay: Payer: No Typology Code available for payment source

## 2023-06-05 ENCOUNTER — Inpatient Hospital Stay: Payer: No Typology Code available for payment source | Attending: Oncology | Admitting: Oncology

## 2023-06-05 DIAGNOSIS — Z23 Encounter for immunization: Secondary | ICD-10-CM | POA: Diagnosis not present

## 2023-06-05 DIAGNOSIS — C50412 Malignant neoplasm of upper-outer quadrant of left female breast: Secondary | ICD-10-CM | POA: Diagnosis not present

## 2023-06-05 DIAGNOSIS — C50912 Malignant neoplasm of unspecified site of left female breast: Secondary | ICD-10-CM | POA: Insufficient documentation

## 2023-06-05 DIAGNOSIS — Z5189 Encounter for other specified aftercare: Secondary | ICD-10-CM | POA: Diagnosis not present

## 2023-06-05 DIAGNOSIS — Z5111 Encounter for antineoplastic chemotherapy: Secondary | ICD-10-CM | POA: Diagnosis present

## 2023-06-05 DIAGNOSIS — Z170 Estrogen receptor positive status [ER+]: Secondary | ICD-10-CM | POA: Diagnosis not present

## 2023-06-05 LAB — CMP (CANCER CENTER ONLY)
ALT: 21 U/L (ref 0–44)
AST: 21 U/L (ref 15–41)
Albumin: 3.5 g/dL (ref 3.5–5.0)
Alkaline Phosphatase: 101 U/L (ref 38–126)
Anion gap: 10 (ref 5–15)
BUN: 11 mg/dL (ref 6–20)
CO2: 24 mmol/L (ref 22–32)
Calcium: 9 mg/dL (ref 8.9–10.3)
Chloride: 106 mmol/L (ref 98–111)
Creatinine: 0.57 mg/dL (ref 0.44–1.00)
GFR, Estimated: >60 mL/min (ref >60)
Glucose, Bld: 148 mg/dL — ABNORMAL HIGH (ref 70–99)
Potassium: 3.7 mmol/L (ref 3.5–5.1)
Sodium: 140 mmol/L (ref 135–145)
Total Bilirubin: 0.5 mg/dL (ref 0.3–1.2)
Total Protein: 6.9 g/dL (ref 6.5–8.1)

## 2023-06-05 LAB — PREGNANCY, URINE: Preg Test, Ur: NEGATIVE

## 2023-06-05 LAB — CBC WITH DIFFERENTIAL (CANCER CENTER ONLY)
Abs Immature Granulocytes: 0.05 10*3/uL (ref 0.00–0.07)
Basophils Absolute: 0.1 10*3/uL (ref 0.0–0.1)
Basophils Relative: 1 %
Eosinophils Absolute: 0 10*3/uL (ref 0.0–0.5)
Eosinophils Relative: 0 %
HCT: 36.9 % (ref 36.0–46.0)
Hemoglobin: 12 g/dL (ref 12.0–15.0)
Immature Granulocytes: 1 %
Lymphocytes Relative: 27 %
Lymphs Abs: 1.9 10*3/uL (ref 0.7–4.0)
MCH: 27.6 pg (ref 26.0–34.0)
MCHC: 32.5 g/dL (ref 30.0–36.0)
MCV: 84.8 fL (ref 80.0–100.0)
Monocytes Absolute: 0.6 10*3/uL (ref 0.1–1.0)
Monocytes Relative: 8 %
Neutro Abs: 4.5 10*3/uL (ref 1.7–7.7)
Neutrophils Relative %: 63 %
Platelet Count: 324 10*3/uL (ref 150–400)
RBC: 4.35 MIL/uL (ref 3.87–5.11)
RDW: 14.9 % (ref 11.5–15.5)
WBC Count: 7.1 10*3/uL (ref 4.0–10.5)
nRBC: 0 % (ref 0.0–0.2)

## 2023-06-06 MED FILL — Docetaxel Soln for IV Infusion 160 MG/16ML: INTRAVENOUS | Qty: 16 | Status: AC

## 2023-06-06 MED FILL — Dexamethasone Sodium Phosphate Inj 100 MG/10ML: INTRAMUSCULAR | Qty: 1 | Status: AC

## 2023-06-06 MED FILL — Cyclophosphamide For Inj 500 MG: INTRAMUSCULAR | Qty: 61 | Status: AC

## 2023-06-07 ENCOUNTER — Inpatient Hospital Stay: Payer: No Typology Code available for payment source

## 2023-06-07 ENCOUNTER — Other Ambulatory Visit: Payer: Self-pay

## 2023-06-07 VITALS — BP 133/60 | HR 86 | Temp 97.9°F | Resp 18 | Ht 61.0 in | Wt 210.1 lb

## 2023-06-07 DIAGNOSIS — C50412 Malignant neoplasm of upper-outer quadrant of left female breast: Secondary | ICD-10-CM

## 2023-06-07 DIAGNOSIS — Z5111 Encounter for antineoplastic chemotherapy: Secondary | ICD-10-CM | POA: Diagnosis not present

## 2023-06-07 MED ORDER — SODIUM CHLORIDE 0.9 % IV SOLN
600.0000 mg/m2 | Freq: Once | INTRAVENOUS | Status: AC
  Administered 2023-06-07: 1220 mg via INTRAVENOUS
  Filled 2023-06-07: qty 50

## 2023-06-07 MED ORDER — INFLUENZA VIRUS VACC SPLIT PF (FLUZONE) 0.5 ML IM SUSY
0.5000 mL | PREFILLED_SYRINGE | Freq: Once | INTRAMUSCULAR | Status: AC
  Administered 2023-06-07: 0.5 mL via INTRAMUSCULAR
  Filled 2023-06-07: qty 0.5

## 2023-06-07 MED ORDER — SODIUM CHLORIDE 0.9 % IV SOLN: Freq: Once | INTRAVENOUS | Status: AC

## 2023-06-07 MED ORDER — HEPARIN SOD (PORK) LOCK FLUSH 100 UNIT/ML IV SOLN
500.0000 [IU] | Freq: Once | INTRAVENOUS | Status: AC | PRN
  Administered 2023-06-07: 500 [IU]

## 2023-06-07 MED ORDER — SODIUM CHLORIDE 0.9 % IV SOLN
75.0000 mg/m2 | Freq: Once | INTRAVENOUS | Status: AC
  Administered 2023-06-07: 160 mg via INTRAVENOUS
  Filled 2023-06-07: qty 16

## 2023-06-07 MED ORDER — SODIUM CHLORIDE 0.9% FLUSH
10.0000 mL | INTRAVENOUS | Status: DC | PRN
  Administered 2023-06-07: 10 mL

## 2023-06-07 MED ORDER — PALONOSETRON HCL INJECTION 0.25 MG/5ML
0.2500 mg | Freq: Once | INTRAVENOUS | Status: AC
  Administered 2023-06-07: 0.25 mg via INTRAVENOUS
  Filled 2023-06-07: qty 5

## 2023-06-07 MED ORDER — SODIUM CHLORIDE 0.9 % IV SOLN
10.0000 mg | Freq: Once | INTRAVENOUS | Status: AC
  Administered 2023-06-07: 10 mg via INTRAVENOUS
  Filled 2023-06-07: qty 10

## 2023-06-07 NOTE — Patient Instructions (Signed)
Cyclophosphamide Injection Qu es este medicamento? La CICLOFOSFAMIDA trata algunos tipos de cncer. Acta desacelerando el crecimiento de clulas cancerosas. Este medicamento puede ser utilizado para otros usos; si tiene alguna pregunta consulte con su proveedor de atencin mdica o con su farmacutico. MARCAS COMUNES: Cyclophosphamide, Cytoxan, Neosar Qu le debo informar a mi profesional de la salud antes de tomar este medicamento? Necesitan saber si usted presenta alguno de los siguientes problemas o situaciones: Enfermedad cardiaca Ritmo cardiaco o frecuencia cardiaca irregular Infeccin Problemas renales Enfermedad heptica Niveles bajos de clulas sanguneas (glbulos blancos, plaquetas o glbulos rojos) Enfermedad pulmonar Radioterapia previa Dificultad para orinar Una reaccin alrgica o inusual a la ciclofosfamida, a otros medicamentos, alimentos, colorantes o conservantes Si est embarazada o buscando quedar embarazada Si est amamantando a un beb Cmo debo utilizar este medicamento? Este medicamento se inyecta en una vena. Su equipo de atencin lo [employer] en un hospital o en un entorno clnico. Hable con su equipo de atencin sobre el uso de este medicamento en nios. Puede requerir atencin especial. Sobredosis: Pngase en contacto inmediatamente con un centro toxicolgico o una sala de urgencia si usted cree que haya tomado demasiado medicamento.<br>ATENCIN: [employer] es solo para usted. No comparta este medicamento con nadie. Qu sucede si me olvido de una dosis? Cumpla con las citas para dosis de seguimiento. Es importante no olvidar ninguna dosis. Llame a su equipo de atencin si no puede asistir a una cita. Qu puede interactuar con este medicamento? Anfotericina B Amiodarona Azatioprina Ciertos medicamentos antivirales para VIH o hepatitis Ciertos medicamentos para la presin sangunea, tales como enalapril, lisinopril,  quinapril Ciclosporina Diurticos Etanercept Indometacina Medicamentos para relajar los msculos Metronidazol Natalizumab Tamoxifeno Warfarina Puede ser que esta lista no menciona todas las posibles interacciones. Informe a su profesional de [employer] de [employer] productos a base de hierbas, medicamentos de [place] o suplementos nutritivos que est tomando. Si usted fuma, consume bebidas alcohlicas o si utiliza drogas ilegales, indqueselo tambin a su profesional de [employer]. Algunas sustancias pueden interactuar con su medicamento. A qu debo estar atento al usar [employer]? Este medicamento podra hacerle sentir un Risk analyst. Esto no es inusual, ya que la quimioterapia puede afectar tanto a las clulas sanas como a las clulas cancerosas. Si presenta algn efecto secundario, infrmelo. Contine con el tratamiento incluso si se siente enfermo, a menos que su equipo de [employer] lo suspenda. Usted podra necesitar realizarse [employer] de sangre mientras est usando [place]. Este medicamento puede aumentar su riesgo de contraer una infeccin. Llame para pedir consejo a su equipo de atencin si tiene fiebre, escalofros, dolor de garganta o cualquier otro sntoma de resfriado o gripe. No se trate usted mismo. Trate de no acercarse a personas que estn enfermas. Evite usar medicamentos que contengan aspirina, acetaminofeno, ibuprofeno, naproxeno o ketoprofeno, a menos que as lo indique su equipo de atencin. Estos medicamentos pueden ocultar la fiebre. Proceda con cuidado al cepillar sus dientes, usar hilo dental o Chemical engineer palillos para los dientes, ya que podra contraer una infeccin o Geophysicist/field seismologist con mayor facilidad. Si recibe algn tratamiento dental, informe a su dentista que est [employer]. Beba agua u otros lquidos segn lo indicado. Orine con frecuencia, incluso de noche. Algunos productos pueden contener alcohol. Pregunte a su equipo de  atencin si este medicamento contiene alcohol. Asegrese de informar a todos los equipos de atencin que est usando este medicamento. Ciertos medicamentos, como metronidazol y disulfiram, pueden causar una reaccin desagradable cuando  se usan con alcohol. Esta reaccin incluye enrojecimiento, dolor de cabeza, nuseas, vmitos, sudoracin y aumento de la sed. La reaccin puede durar de 30 minutos a varias horas. Informe a su equipo de atencin si est buscando un embarazo o si cree que podra estar embarazada. Este medicamento puede causar defectos congnitos graves si se [place] durante el embarazo y por 1 ao despus de la ltima dosis. Se requiere una prueba de embarazo con resultado negativo antes de Games developer a Producer, television/film/video. Se recomienda Chemical engineer un mtodo anticonceptivo confiable mientras est usando este medicamento y durante 1 ao despus de la ltima dosis. Hable con su equipo de atencin [employer] mtodos anticonceptivos confiables. No embarace a Careers information officer est usando este medicamento y durante 4 meses despus de la ltima dosis. Use un condn [employer]. No debe amamantar a un beb mientras est usando este medicamento o por 1 semana despus de la ltima dosis. Este medicamento podra causar infertilidad. Hable con su equipo de atencin si le preocupa su fertilidad. Hable con su equipo de atencin sobre su riesgo de cncer. Usted podra tener mayor riesgo de desarrollar ciertos tipos de cncer si [place] este medicamento. Qu efectos secundarios puedo tener al [employer] este medicamento? Efectos secundarios que debe informar a su equipo de atencin tan pronto como sea posible: Reacciones alrgicas: erupcin cutnea, comezn/picazn, urticaria, hinchazn de la cara, los labios, la lengua o la garganta Tos seca, falta de aire o problemas para respirar Insuficiencia cardiaca: falta de aire, hinchazn de los tobillos, los pies o las manos, aumento de peso repentino, debilidad o  fatiga inusuales Inflamacin del msculo cardiaco: debilidad o fatiga inusuales, falta de aire, dolor en el pecho, frecuencia cardaca rpida o irregular, mareos, hinchazn de los tobillos, los pies o las manos. Cambios en el ritmo cardiaco: frecuencia cardiaca rpida o irregular, mareos, sensacin de desmayo o aturdimiento, Journalist, newspaper, dificultad para respirar Infeccin: fiebre, escalofros, tos, dolor de garganta, heridas que no sanan, dolor o problemas para Geographical information systems officer, sensacin general de molestia o malestar Lesin en los riones: disminucin en la cantidad de orina, hinchazn de los tobillos, las manos o los pies Lesin en el hgado: Engineer, mining en la regin abdominal superior derecha, prdida de apetito, nuseas, heces de color claro, orina amarilla oscura o marrn, color amarillento de los ojos o la piel, debilidad o fatiga inusuales Recuento bajo de glbulos rojos: debilidad o fatiga inusuales, mareo, dolor de [place], dificultad para respirar Nivel bajo de sodio: debilidad muscular, fatiga, mareos, dolor de cabeza, confusin Orina roja o marrn oscuro Sangrado o moretones inusuales Efectos secundarios que generalmente no requieren atencin mdica (debe informarlos a su equipo de atencin si persisten o si son molestos): Cada del cabello Ciclos menstruales irregulares o sangrado ligero entre periodos menstruales Prdida del apetito Therapist, sports, enrojecimiento o hinchazn con llagas dentro de la boca o la garganta Vmito Puede ser que esta lista no menciona todos los posibles efectos secundarios. Comunquese a su mdico por asesoramiento mdico [employer]. Usted puede informar los efectos secundarios a la FDA por telfono al 1-800-FDA-1088. Dnde debo guardar mi medicina? Este medicamento se administra en hospitales o clnicas. No se guarda en su casa. <b>ATENCIN: Este folleto es un resumen. Puede ser que no cubra toda la posible informacin. Si usted tiene preguntas  acerca de esta medicina, consulte con su mdico, su farmacutico o su profesional de Radiographer, therapeutic.</b>  2024 Elsevier/Gold Standard (2022-07-27 00:00:00) [place] es este medicamento? El PACLITAXEL trata algunos  tipos de cncer. Acta desacelerando el crecimiento de clulas cancerosas. Este medicamento puede ser utilizado para otros usos; si tiene alguna pregunta consulte con su proveedor de atencin mdica o con su farmacutico. MARCAS COMUNES: Onxol, Taxol Qu le debo informar a mi profesional de la salud antes de tomar este medicamento? Necesitan saber si usted presenta alguno de los siguientes problemas o situaciones: Enfermedad cardiaca Enfermedad heptica Niveles bajos de glbulos blancos Una reaccin alrgica o inusual al paclitaxel, a otros medicamentos, alimentos, colorantes o conservantes Si usted o su pareja est embarazada o intentando quedar embarazada Si est amamantando a un beb Cmo debo Visual merchandiser medicamento? Este medicamento se inyecta en una vena. Su equipo de atencin lo [employer] en un hospital o en un entorno clnico. Hable con su equipo de atencin sobre el uso de este medicamento en nios. Aunque este medicamento se puede administrar a nios con ciertas afecciones, existen precauciones que deben tomarse. Sobredosis: Pngase en contacto inmediatamente con un centro toxicolgico o una sala de urgencia si usted cree que haya tomado demasiado medicamento.<br>ATENCIN: [employer] es solo para usted. No comparta este medicamento con nadie. Qu sucede si me olvido de una dosis? Cumpla con las citas para dosis de seguimiento. Es importante no olvidar ninguna dosis. Llame a su equipo de atencin si no puede asistir a una cita. Qu puede interactuar con este medicamento? No use este medicamento con ninguno de los siguientes productos: Vacunas de virus vivos Otros medicamentos podran afectar la forma en que funciona este medicamento. Hable con su equipo de atencin sobre  todos los medicamentos que [place]. Es posible que Engineer, civil (consulting) en su plan de tratamiento para reducir el riesgo de efectos secundarios y asegurarse de que los medicamentos actan del modo previsto. Puede ser que esta lista no menciona todas las posibles interacciones. Informe a su profesional de [employer] de [employer] productos a base de hierbas, medicamentos de [place] o suplementos nutritivos que est tomando. Si usted fuma, consume bebidas alcohlicas o si utiliza drogas ilegales, indqueselo tambin a su profesional de [employer]. Algunas sustancias pueden interactuar con su medicamento. A qu debo estar atento al usar [employer]? Se supervisar su estado de salud atentamente mientras reciba este medicamento. Usted podra necesitar realizarse [employer] de sangre mientras est usando [place]. Este medicamento podra hacerle sentir un Risk analyst. Esto no es inusual, ya que la quimioterapia puede afectar tanto a las clulas sanas como a las clulas cancerosas. Si presenta algn efecto secundario, infrmelo. Contine con el tratamiento incluso si se siente enfermo, a menos que su equipo de [employer] lo suspenda. Este medicamento puede causar Therapist, art graves. Para reducir Nurse, adult, su equipo de atencin puede darle otros medicamentos que deber usar antes de Regulatory affairs officer. Asegrese de seguir las instrucciones de su equipo de atencin. Este medicamento puede aumentar su riesgo de contraer una infeccin. Llame para pedir consejo a su equipo de atencin si tiene fiebre, escalofros, dolor de garganta o cualquier otro sntoma de resfriado o gripe. No se trate usted mismo. Trate de no acercarse a personas que estn enfermas. Este medicamento podra aumentar el riesgo de moretones o sangrado. Llame a su equipo de atencin si observa sangrados inusuales. Proceda con cuidado al cepillar sus dientes, usar hilo dental o Chemical engineer palillos para los dientes, ya que  podra contraer una infeccin o Geophysicist/field seismologist con mayor facilidad. Si recibe algn tratamiento dental, informe a su dentista que est [employer]. Hable con su equipo  de atencin si podra estar embarazada. Este medicamento puede causar defectos congnitos graves si se [place] durante el [place]. Hable con su equipo de atencin antes de amamantar. Es posible que sea Passenger transport manager cambios en su plan de tratamiento. Qu efectos secundarios puedo tener al [employer] este medicamento? Efectos secundarios que debe informar a su equipo de atencin tan pronto como sea posible: Reacciones alrgicas: erupcin cutnea, comezn/picazn, urticaria, hinchazn de la cara, los labios, la lengua o la garganta Cambios en el ritmo cardiaco: frecuencia cardiaca rpida o irregular, mareos, sensacin de desmayo o aturdimiento, Journalist, newspaper, dificultad para respirar Aumento de la presin arterial Infeccin: fiebre, escalofros, tos, dolor de garganta, heridas que no sanan, dolor o problemas para Geographical information systems officer, sensacin general de Engineer, structural Presin arterial baja: mareo, sensacin de desmayo o aturdimiento, visin borrosa Recuento bajo de glbulos rojos: debilidad o fatiga inusuales, mareo, dolor de cabeza, dificultad para respirar Hinchazn dolorosa, calor o enrojecimiento de la piel, ampollas o llagas en el sitio de la infusin Dolor, hormigueo o entumecimiento de las manos o los pies Frecuencia cardiaca lenta: mareos, sensacin de desmayo o aturdimiento, confusin, problemas para respirar, debilidad o fatiga inusuales Sangrado o moretones inusuales Efectos secundarios que generalmente no requieren atencin mdica (debe informarlos a su equipo de atencin si persisten o si son molestos): Diarrea Cada del Water engineer en las articulaciones Prdida del apetito Dolor muscular Nuseas Vmito Puede ser que esta lista no menciona todos los posibles efectos secundarios. Comunquese a su mdico por  asesoramiento mdico [employer]. Usted puede informar los efectos secundarios a la FDA por telfono al 1-800-FDA-1088. Dnde debo guardar mi medicina? Este medicamento se administra en hospitales o clnicas. No se guarda en su casa. <b>ATENCIN: Este folleto es un resumen. Puede ser que no cubra toda la posible informacin. Si usted tiene preguntas acerca de esta medicina, consulte con su mdico, su farmacutico o su profesional de Radiographer, therapeutic.</b>  2024 Elsevier/Gold Standard (2022-07-27 00:00:00)

## 2023-06-09 ENCOUNTER — Inpatient Hospital Stay: Payer: No Typology Code available for payment source

## 2023-06-09 VITALS — BP 90/60 | HR 94 | Temp 99.2°F | Resp 16 | Ht 61.0 in | Wt 210.1 lb

## 2023-06-09 DIAGNOSIS — Z5111 Encounter for antineoplastic chemotherapy: Secondary | ICD-10-CM | POA: Diagnosis not present

## 2023-06-09 DIAGNOSIS — Z170 Estrogen receptor positive status [ER+]: Secondary | ICD-10-CM

## 2023-06-09 MED ORDER — PEGFILGRASTIM-CBQV 6 MG/0.6ML ~~LOC~~ SOSY
6.0000 mg | PREFILLED_SYRINGE | Freq: Once | SUBCUTANEOUS | Status: AC
  Administered 2023-06-09: 6 mg via SUBCUTANEOUS
  Filled 2023-06-09: qty 0.6

## 2023-06-25 NOTE — Progress Notes (Unsigned)
[facility]  [address] [place],  [place]  [address] [phone]  Clinic Day:  06/26/2023  Referring physician: Weston Settle, MD   HISTORY OF PRESENT ILLNESS:  Kendra Collins is a 57 y.o. female withstage IA (T1c N0 M0) hormone positive breast cancer, status post a left breast lumpectomy in June 2024.   Due to having a high Oncotype score, the patient is receiving adjuvant Taxotere/Cytoxan chemotherapy.  She comes in today to be evaluated before heading into her 4th and final cycle of treatment.  The patient claims to have tolerated her 3rd cycle relatively well.   She claims her weakness lasted longer with her last cycle of treatment.  She denies having any findings which concern her for early disease recurrence.     PHYSICAL EXAM:  Blood pressure 127/75, pulse 94, temperature 98 F (36.7 C), resp. rate 16, height 5\' 1"  (1.549 m), weight 203 lb 14.4 oz (92.5 kg), SpO2 96%. Wt Readings from Last 3 Encounters:  06/26/23 203 lb 14.4 oz (92.5 kg)  06/09/23 210 lb 1.9 oz (95.3 kg)  06/07/23 210 lb 1.9 oz (95.3 kg)   Body mass index is 38.53 kg/m. Performance status (ECOG): 0 - Asymptomatic Physical Exam Constitutional:      Appearance: Normal appearance.  HENT:     Mouth/Throat:     Pharynx: Oropharynx is clear. No oropharyngeal exudate.  Cardiovascular:     Rate and Rhythm: Normal rate and regular rhythm.     Heart sounds: No murmur heard.    No friction rub. No gallop.  Pulmonary:     Breath sounds: Normal breath sounds.  Chest:  Breasts:    Right: No swelling, bleeding, inverted nipple, mass, nipple discharge or skin change.     Left: No swelling, bleeding, inverted nipple, mass, nipple discharge or skin change.     Comments: Her left breast has healed very well from her recent surgery. Abdominal:     General: Bowel sounds are normal. There is no distension.     Palpations: Abdomen is soft. There is no mass.     Tenderness: There is  no abdominal tenderness.  Musculoskeletal:        General: No tenderness.     Cervical back: Normal range of motion and neck supple.     Right lower leg: No edema.     Left lower leg: No edema.  Lymphadenopathy:     Cervical: No cervical adenopathy.     Right cervical: No superficial, deep or posterior cervical adenopathy.    Left cervical: No superficial, deep or posterior cervical adenopathy.     Upper Body:     Right upper body: No supraclavicular or axillary adenopathy.     Left upper body: No supraclavicular or axillary adenopathy.     Lower Body: No right inguinal adenopathy. No left inguinal adenopathy.  Skin:    Coloration: Skin is not jaundiced.     Findings: No lesion or rash.  Neurological:     General: No focal deficit present.     Mental Status: She is alert and oriented to person, place, and time. Mental status is at baseline.  Psychiatric:        Mood and Affect: Mood normal.        Behavior: Behavior normal.        Thought Content: Thought content normal.        Judgment: Judgment normal.    LABS:  Latest Reference Range &  Units 06/26/23 00:00 06/26/23 09:14  Sodium 135 - 145 mmol/L  141  Potassium 3.5 - 5.1 mmol/L  3.6  Chloride 98 - 111 mmol/L  107  CO2 22 - 32 mmol/L  25  Glucose 70 - 99 mg/dL  846 (H)  BUN 6 - 20 mg/dL  8  Creatinine 9.62 - 9.52 mg/dL  8.41  Calcium 8.9 - 32.4 mg/dL  8.8 (L)  Anion gap 5 - 15   9  Alkaline Phosphatase 38 - 126 U/L  84  Albumin 3.5 - 5.0 g/dL  3.5  AST 15 - 41 U/L  24  ALT 0 - 44 U/L  26  Total Protein 6.5 - 8.1 g/dL  6.8  Total Bilirubin 0.3 - 1.2 mg/dL  0.4  GFR, Est Non African American >60 mL/min  >60  WBC  7.9 (E)   RBC 3.87 - 5.11  4.21 (E)   Hemoglobin 12.0 - 16.0  11.6 ! (E)   HCT 36 - 46  34 ! (E)   Platelets 150 - 400 K/uL 255 (E)   NEUT#  5.21 (E)   Preg Test, Ur NEGATIVE   NEGATIVE  (H): Data is abnormally high (L): Data is abnormally low !: Data is abnormal (E): External lab result ASSESSMENT &  PLAN:  Kendra Collins is a 57 y.o. 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  She will proceed with her fourth and final cycle of Taxotere/Cytoxan this week.  She will continue to receive Neulasta with each cycle of treatment to prevent severe neutropenia from causing any complications.  In clinic today, I reassured the patient that her progressive fatigue is not uncommon, which is related to the cumulative effects of all of her previous cycles of treatment.  She knows to give herself more time to do chores that she usually would not have a problem doing.  I will see this patient back in 4 weeks for repeat clinical assessment.  At that time, she will move to the next phase of therapy, which will consist of both adjuvant breast radiation and endocrine therapy.  The patient understands all the plans discussed today and is in agreement with them.  Claudie Brickhouse Kirby Funk, MD

## 2023-06-26 ENCOUNTER — Other Ambulatory Visit: Payer: Self-pay | Admitting: Oncology

## 2023-06-26 ENCOUNTER — Inpatient Hospital Stay: Payer: No Typology Code available for payment source

## 2023-06-26 ENCOUNTER — Inpatient Hospital Stay: Payer: No Typology Code available for payment source | Attending: Oncology | Admitting: Oncology

## 2023-06-26 DIAGNOSIS — Z170 Estrogen receptor positive status [ER+]: Secondary | ICD-10-CM

## 2023-06-26 DIAGNOSIS — Z5189 Encounter for other specified aftercare: Secondary | ICD-10-CM | POA: Diagnosis not present

## 2023-06-26 DIAGNOSIS — C50912 Malignant neoplasm of unspecified site of left female breast: Secondary | ICD-10-CM | POA: Diagnosis present

## 2023-06-26 DIAGNOSIS — C50412 Malignant neoplasm of upper-outer quadrant of left female breast: Secondary | ICD-10-CM

## 2023-06-26 DIAGNOSIS — Z5111 Encounter for antineoplastic chemotherapy: Secondary | ICD-10-CM | POA: Diagnosis present

## 2023-06-26 LAB — PREGNANCY, URINE: Preg Test, Ur: NEGATIVE

## 2023-06-26 LAB — CMP (CANCER CENTER ONLY)
ALT: 26 U/L (ref 0–44)
AST: 24 U/L (ref 15–41)
Albumin: 3.5 g/dL (ref 3.5–5.0)
Alkaline Phosphatase: 84 U/L (ref 38–126)
Anion gap: 9 (ref 5–15)
BUN: 8 mg/dL (ref 6–20)
CO2: 25 mmol/L (ref 22–32)
Calcium: 8.8 mg/dL — ABNORMAL LOW (ref 8.9–10.3)
Chloride: 107 mmol/L (ref 98–111)
Creatinine: 0.57 mg/dL (ref 0.44–1.00)
GFR, Estimated: >60 mL/min (ref >60)
Glucose, Bld: 134 mg/dL — ABNORMAL HIGH (ref 70–99)
Potassium: 3.6 mmol/L (ref 3.5–5.1)
Sodium: 141 mmol/L (ref 135–145)
Total Bilirubin: 0.4 mg/dL (ref 0.3–1.2)
Total Protein: 6.8 g/dL (ref 6.5–8.1)

## 2023-06-26 LAB — CBC AND DIFFERENTIAL
HCT: 34 — AB (ref 36–46)
Hemoglobin: 11.6 — AB (ref 12.0–16.0)
Neutrophils Absolute: 5.21
Platelets: 255 10*3/uL (ref 150–400)
WBC: 7.9

## 2023-06-26 LAB — CBC: RBC: 4.21 (ref 3.87–5.11)

## 2023-06-27 ENCOUNTER — Telehealth: Payer: Self-pay | Admitting: Oncology

## 2023-06-27 MED FILL — Docetaxel Soln for IV Infusion 160 MG/16ML: INTRAVENOUS | Qty: 16 | Status: AC

## 2023-06-27 MED FILL — Dexamethasone Sodium Phosphate Inj 100 MG/10ML: INTRAMUSCULAR | Qty: 1 | Status: AC

## 2023-06-27 NOTE — Telephone Encounter (Signed)
Contacted pt to schedule an appt. Unable to reach via phone.  Message Header  Department: [facility] [[id number]]   Scheduling Message Entered by Rennis Harding A on 06/26/2023 at  6:19 PM Priority: Routine <No visit type provided>  Department: [facility]  Provider:  Scheduling Notes:  Labs/appt 07-24-23

## 2023-06-28 ENCOUNTER — Inpatient Hospital Stay: Payer: No Typology Code available for payment source

## 2023-06-28 VITALS — BP 169/84 | HR 85 | Temp 98.6°F | Resp 18 | Ht 61.0 in | Wt 205.0 lb

## 2023-06-28 DIAGNOSIS — Z5111 Encounter for antineoplastic chemotherapy: Secondary | ICD-10-CM | POA: Diagnosis not present

## 2023-06-28 DIAGNOSIS — C50412 Malignant neoplasm of upper-outer quadrant of left female breast: Secondary | ICD-10-CM

## 2023-06-28 MED ORDER — SODIUM CHLORIDE 0.9 % IV SOLN
600.0000 mg/m2 | Freq: Once | INTRAVENOUS | Status: AC
  Administered 2023-06-28: 1220 mg via INTRAVENOUS
  Filled 2023-06-28: qty 61

## 2023-06-28 MED ORDER — SODIUM CHLORIDE 0.9% FLUSH
10.0000 mL | INTRAVENOUS | Status: DC | PRN
  Administered 2023-06-28: 10 mL

## 2023-06-28 MED ORDER — HEPARIN SOD (PORK) LOCK FLUSH 100 UNIT/ML IV SOLN
500.0000 [IU] | Freq: Once | INTRAVENOUS | Status: AC | PRN
  Administered 2023-06-28: 500 [IU]

## 2023-06-28 MED ORDER — SODIUM CHLORIDE 0.9 % IV SOLN
10.0000 mg | Freq: Once | INTRAVENOUS | Status: AC
  Administered 2023-06-28: 10 mg via INTRAVENOUS
  Filled 2023-06-28: qty 10

## 2023-06-28 MED ORDER — SODIUM CHLORIDE 0.9 % IV SOLN
75.0000 mg/m2 | Freq: Once | INTRAVENOUS | Status: AC
  Administered 2023-06-28: 160 mg via INTRAVENOUS
  Filled 2023-06-28: qty 16

## 2023-06-28 MED ORDER — SODIUM CHLORIDE 0.9 % IV SOLN: Freq: Once | INTRAVENOUS | Status: AC

## 2023-06-28 MED ORDER — PALONOSETRON HCL INJECTION 0.25 MG/5ML
0.2500 mg | Freq: Once | INTRAVENOUS | Status: AC
  Administered 2023-06-28: 0.25 mg via INTRAVENOUS
  Filled 2023-06-28: qty 5

## 2023-06-28 NOTE — Patient Instructions (Signed)
Cyclophosphamide Injection Qu es este medicamento? La [place]FAM[place] trata algunos tipos de cncer. Acta desacelerando el crecimiento de clulas cancerosas. Este medicamento puede ser utilizado para otros usos; si tiene alguna pregunta consulte con su proveedor de atencin mdica o con su farmacutico. MARCAS COMUNES: Cyclophosphamide, Cytoxan, Neosar Qu le debo informar a mi profesional de la salud antes de tomar este medicamento? Necesitan saber si usted presenta alguno de los siguientes problemas o situaciones: Enfermedad cardiaca Ritmo cardiaco o frecuencia cardiaca irregular Infeccin Problemas renales Enfermedad heptica Niveles bajos de clulas sanguneas (glbulos blancos, plaquetas o glbulos rojos) Enfermedad pulmonar Radioterapia previa Dificultad para orinar Una reaccin alrgica o inusual a la ciclofosfamida, a otros medicamentos, alimentos, colorantes o conservantes Si est embarazada o buscando quedar embarazada Si est amamantando a un beb Cmo debo utilizar este medicamento? Este medicamento se inyecta en una vena. Su equipo de atencin lo [employer] en un hospital o en un entorno clnico. Hable con su equipo de atencin sobre el uso de este medicamento en nios. Puede requerir atencin especial. Sobredosis: Pngase en contacto inmediatamente con un centro toxicolgico o una sala de urgencia si usted cree que haya tomado demasiado medicamento.<br>ATENCIN: [employer] es solo para usted. No comparta este medicamento con nadie. Qu sucede si me olvido de una dosis? Cumpla con las citas para dosis de seguimiento. Es importante no olvidar ninguna dosis. Llame a su equipo de atencin si no puede asistir a una cita. Qu puede interactuar con este medicamento? Anfotericina B Amiodarona Azatioprina Ciertos medicamentos antivirales para VIH o hepatitis Ciertos medicamentos para la presin sangunea, tales como enalapril, lisinopril,  quinapril Ciclosporina Diurticos Etanercept Indometacina Medicamentos para relajar los msculos Metronidazol Natalizumab Tamoxifeno Warfarina Puede ser que esta lista no menciona todas las posibles interacciones. Informe a su profesional de [employer] de [employer] productos a base de hierbas, medicamentos de [place] o suplementos nutritivos que est tomando. Si usted fuma, consume bebidas alcohlicas o si utiliza drogas ilegales, indqueselo tambin a su profesional de [employer]. Algunas sustancias pueden interactuar con su medicamento. A qu debo estar atento al usar [employer]? Este medicamento podra hacerle sentir un Risk analyst. Esto no es inusual, ya que la quimioterapia puede afectar tanto a las clulas sanas como a las clulas cancerosas. Si presenta algn efecto secundario, infrmelo. Contine con el tratamiento incluso si se siente enfermo, a menos que su equipo de [employer] lo suspenda. Usted podra necesitar realizarse [employer] de sangre mientras est usando [place]. Este medicamento puede aumentar su riesgo de contraer una infeccin. Llame para pedir consejo a su equipo de atencin si tiene fiebre, escalofros, dolor de garganta o cualquier otro sntoma de resfriado o gripe. No se trate usted mismo. Trate de no acercarse a personas que estn enfermas. Evite usar medicamentos que contengan aspirina, acetaminofeno, ibuprofeno, naproxeno o ketoprofeno, a menos que as lo indique su equipo de atencin. Estos medicamentos pueden ocultar la fiebre. Proceda con cuidado al cepillar sus dientes, usar hilo dental o Chemical engineer palillos para los dientes, ya que podra contraer una infeccin o Geophysicist/field seismologist con mayor facilidad. Si recibe algn tratamiento dental, informe a su dentista que est [employer]. Beba agua u otros lquidos segn lo indicado. Orine con frecuencia, incluso de noche. Algunos productos pueden contener alcohol. Pregunte a su equipo de  atencin si este medicamento contiene alcohol. Asegrese de informar a todos los equipos de atencin que est usando este medicamento. Ciertos medicamentos, como metronidazol y disulfiram, pueden causar una reaccin desagradable cuando  se usan con alcohol. Esta reaccin incluye enrojecimiento, dolor de cabeza, nuseas, vmitos, sudoracin y aumento de la sed. La reaccin puede durar de 30 minutos a varias horas. Informe a su equipo de atencin si est buscando un embarazo o si cree que podra estar embarazada. Este medicamento puede causar defectos congnitos graves si se [place] durante el embarazo y por 1 ao despus de la ltima dosis. Se requiere una prueba de embarazo con resultado negativo antes de Games developer a Producer, television/film/video. Se recomienda Chemical engineer un mtodo anticonceptivo confiable mientras est usando este medicamento y durante 1 ao despus de la ltima dosis. Hable con su equipo de atencin [employer] mtodos anticonceptivos confiables. No embarace a Careers information officer est usando este medicamento y durante 4 meses despus de la ltima dosis. Use un condn [employer]. No debe amamantar a un beb mientras est usando este medicamento o por 1 semana despus de la ltima dosis. Este medicamento podra causar infertilidad. Hable con su equipo de atencin si le preocupa su fertilidad. Hable con su equipo de atencin sobre su riesgo de cncer. Usted podra tener mayor riesgo de desarrollar ciertos tipos de cncer si [place] este medicamento. Qu efectos secundarios puedo tener al [employer] este medicamento? Efectos secundarios que debe informar a su equipo de atencin tan pronto como sea posible: Reacciones alrgicas: erupcin cutnea, comezn/picazn, urticaria, hinchazn de la cara, los labios, la lengua o la garganta Tos seca, falta de aire o problemas para respirar Insuficiencia cardiaca: falta de aire, hinchazn de los tobillos, los pies o las manos, aumento de peso repentino, debilidad o  fatiga inusuales Inflamacin del msculo cardiaco: debilidad o fatiga inusuales, falta de aire, dolor en el pecho, frecuencia cardaca rpida o irregular, mareos, hinchazn de los tobillos, los pies o las manos. Cambios en el ritmo cardiaco: frecuencia cardiaca rpida o irregular, mareos, sensacin de desmayo o aturdimiento, Journalist, newspaper, dificultad para respirar Infeccin: fiebre, escalofros, tos, dolor de garganta, heridas que no sanan, dolor o problemas para Geographical information systems officer, sensacin general de molestia o malestar Lesin en los riones: disminucin en la cantidad de orina, hinchazn de los tobillos, las manos o los pies Lesin en el hgado: Engineer, mining en la regin abdominal superior derecha, prdida de apetito, nuseas, heces de color claro, orina amarilla oscura o marrn, color amarillento de los ojos o la piel, debilidad o fatiga inusuales Recuento bajo de glbulos rojos: debilidad o fatiga inusuales, mareo, dolor de [place], dificultad para respirar Nivel bajo de sodio: debilidad muscular, fatiga, mareos, dolor de cabeza, confusin Orina roja o marrn oscuro Sangrado o moretones inusuales Efectos secundarios que generalmente no requieren atencin mdica (debe informarlos a su equipo de atencin si persisten o si son molestos): Cada del cabello Ciclos menstruales irregulares o sangrado ligero entre periodos menstruales Prdida del apetito Therapist, sports, enrojecimiento o hinchazn con llagas dentro de la boca o la garganta Vmito Puede ser que esta lista no menciona todos los posibles efectos secundarios. Comunquese a su mdico por asesoramiento mdico [employer]ard. Usted puede informar los efectos secundarios a la FDA por telfono al 1-800-FDA-1088. Dnde debo guardar mi medicina? Este medicamento se administra en hospitales o clnicas. No se guarda en su casa. <b>ATENCIN: Este folleto es un resumen. Puede ser que no cubra toda la posible informacin. Si usted tiene preguntas  acerca de esta medicina, consulte con su mdico, su farmacutico o su profesional de Radiographer, therapeutic.</b>  2024 Elsevier/Gold Standard (2022-07-27 00:00:00) Docetaxel Injection Qu es este medicamento? El [place]  trata algunos tipos de cncer. Acta desacelerando el crecimiento de clulas cancerosas. Este medicamento puede ser utilizado para otros usos; si tiene alguna pregunta consulte con su proveedor de atencin mdica o con su farmacutico. MARCAS COMUNES: Docefrez, Docivyx, Taxotere Qu le debo informar a mi profesional de la salud antes de tomar este medicamento? Necesitan saber si usted presenta alguno de los siguientes problemas o situaciones: Enfermedad renal Enfermedad heptica Niveles bajos de glbulos blancos Hormigueo en los dedos de las manos o los pies, u otro trastorno del sistema nervioso Una reaccin alrgica o inusual al docetaxel, al polisorbato 80, a otros medicamentos, alimentos, colorantes o conservantes Si est embarazada o buscando quedar embarazada Si est amamantando a un beb Cmo debo Visual merchandiser medicamento? Este medicamento se inyecta en una vena. Su equipo de atencin lo [employer] en un hospital o en un entorno clnico. Hable con su equipo de atencin sobre el uso de este medicamento en nios. Puede requerir atencin especial. Sobredosis: Pngase en contacto inmediatamente con un centro toxicolgico o una sala de urgencia si usted cree que haya tomado demasiado medicamento.<br>ATENCIN: [employer] es solo para usted. No comparta este medicamento con nadie. Qu sucede si me olvido de una dosis? Cumpla con las citas para dosis de seguimiento. Es importante no olvidar ninguna dosis. Llame a su equipo de atencin si no puede asistir a una cita. Qu puede interactuar con este medicamento? No use este medicamento con ninguno de los siguientes productos: Administrator, arts de virus vivos Este medicamento tambin podra Product/process development scientist con los siguientes  productos: Ciertos antibiticos, tales Copywriter, advertising, telitromicina Ciertos medicamentos antivirales para VIH o hepatitis Ciertos medicamentos para las [employer], tales como itraconazol, ketoconazol, voriconazol Jugo de toronja (pomelo) Nefazodona Suplementos, como hierba de [place] Puede ser que esta lista no menciona todas las posibles interacciones. Informe a su profesional de [employer] de In[employer] Inc productos a base de hierbas, medicamentos de [place] o suplementos nutritivos que est tomando. Si usted fuma, consume bebidas alcohlicas o si utiliza drogas ilegales, indqueselo tambin a su profesional de [employer]. Algunas sustancias pueden interactuar con su medicamento. A qu debo estar atento al usar [employer]? Este medicamento podra hacerle sentir un Risk analyst. Esto no es inusual, ya que la quimioterapia puede afectar tanto a las clulas sanas como a las clulas cancerosas. Si presenta algn efecto secundario, infrmelo. Contine con el tratamiento incluso si se siente enfermo, a menos que su equipo de [employer] lo suspenda. Usted podra necesitar realizarse [employer] Corporation de sangre mientras est usando [place]. Este medicamento puede provocar efectos secundarios graves y reacciones a la infusin. Para reducir Nurse, adult, su equipo de atencin puede darle otros medicamentos que deber usar antes de Regulatory affairs officer. Asegrese de seguir las instrucciones de su equipo de atencin. Este medicamento puede aumentar su riesgo de contraer una infeccin. Llame para pedir consejo a su equipo de atencin si tiene fiebre, escalofros, dolor de garganta o cualquier otro sntoma de resfriado o gripe. No se trate usted mismo. Trate de no acercarse a personas que estn enfermas. Evite usar [employer] contienen aspirina, acetaminofeno, ibuprofeno, naproxeno o ketoprofeno, a menos que as lo indique su equipo de atencin. Estos medicamentos pueden ocultar la  fiebre. Proceda con cuidado al cepillar sus dientes, usar hilo dental o Chemical engineer palillos para los dientes, ya que podra contraer una infeccin o Geophysicist/field seismologist con mayor facilidad. Si recibe algn tratamiento dental, informe a su dentista que est [employer]. Algunos productos pueden contener  alcohol. Pregunte a su equipo de atencin si este medicamento contiene alcohol. Asegrese de informar a todos los equipos de atencin que est usando este medicamento. Ciertos medicamentos, como metronidazol y disulfiram, pueden causar una reaccin desagradable cuando se usan con alcohol. Esta reaccin incluye enrojecimiento, dolor de cabeza, nuseas, vmitos, sudoracin y aumento de la sed. La reaccin puede durar de 30 minutos a varias horas. Este medicamento podra afectar su coordinacin, tiempo de reaccin o juicio. No conduzca ni opere maquinaria pesada hasta que sepa cmo le afecta este medicamento. Pngase de pie, sintese o levntese lentamente para reducir el riesgo de mareos o desmayos. Beber alcohol con [employer] puede aumentar el riesgo de estos efectos secundarios. Hable con su equipo de atencin sobre su riesgo de cncer. Usted podra tener mayor riesgo de desarrollar ciertos tipos de cncer si [place] este medicamento. Informe a su equipo de atencin si est buscando un embarazo o si cree que podra estar en embarazo. Este medicamento puede causar defectos congnitos graves si se toma durante el embarazo o si queda embarazada dentro de los 2 meses posteriores a la interrupcin del [place]. Se requiere una prueba de embarazo con resultado negativo antes de Games developer a Producer, television/film/video. Se recomienda usar un mtodo anticonceptivo confiable mientras [place] este medicamento por 2 meses despus de dejar de usarlo. Hable con su equipo de atencin [employer] mtodos anticonceptivos confiables. No amamante mientras est usando este medicamento y durante 1 semana despus de Psychologist, prison and probation services. Use un condn durante las relaciones sexuales mientras est usando este medicamento y por 4 meses despus de la ltima dosis. Informe a su equipo de atencin de inmediato si cree que su pareja podra estar embarazada. Este medicamento puede causar defectos congnitos graves. Este medicamento podra causar infertilidad. Hable con su equipo de atencin si le preocupa su fertilidad. Qu efectos secundarios puedo tener al [employer] este medicamento? Efectos secundarios que debe informar a su equipo de atencin tan pronto como sea posible: Reacciones alrgicas: erupcin cutnea, comezn/picazn, urticaria, hinchazn de la cara, los labios, la lengua o la garganta Cambios en la visin tales como visin borrosa, ver halos alrededor [employer]ant, prdida de visin Infeccin: fiebre, escalofros, tos o dolor de garganta Reacciones a la infusin: dolor en el pecho, falta de aire o dificultad respiratoria, sensacin de desmayo o aturdimiento Recuento bajo de glbulos rojos: debilidad o fatiga inusuales, mareo, dolor de cabeza, dificultad para Dietitian, hormigueo o entumecimiento de las manos o los pies Hinchazn dolorosa, calor o enrojecimiento de la piel, ampollas o llagas en el sitio de la infusin Enrojecimiento, formacin de ampollas, descamacin o distensin de la piel, incluso dentro de la boca Dolor de [address] repentino o intenso, diarrea con Daniel, [place], nuseas, vmitos [employer], las manos o los pies Sndrome de lisis tumoral: nuseas, vmitos, diarrea, disminucin en la cantidad de [place], debilidad o fatiga inusuales, confusin, dolor o calambres musculares, frecuencia cardiaca rpida o irregular, dolor articular Sangrado o moretones inusuales Efectos secundarios que generalmente no requieren atencin mdica (debe informarlos a su equipo de atencin si persisten o si son molestos): Cambio en la forma, grosor o color de las uas Corporate treasurer  sentido del gusto Cada del cabello Aumento de lgrimas Puede ser que esta lista no menciona todos los posibles efectos secundarios. Comunquese a su mdico por asesoramiento mdico [employer]. Usted puede informar los efectos secundarios a la FDA por telfono al 1-800-FDA-1088. Dnde debo guardar mi medicina?  Este medicamento se administra en hospitales o clnicas. No se guarda en su casa. <b>ATENCIN: Este folleto es un resumen. Puede ser que no cubra toda la posible informacin. Si usted tiene preguntas acerca de esta medicina, consulte con su mdico, su farmacutico o su profesional de Radiographer, therapeutic.</b>  2024 Elsevier/Gold Standard (2022-02-02 00:00:00)

## 2023-06-30 ENCOUNTER — Inpatient Hospital Stay: Payer: No Typology Code available for payment source

## 2023-06-30 VITALS — Wt 202.2 lb

## 2023-06-30 DIAGNOSIS — Z170 Estrogen receptor positive status [ER+]: Secondary | ICD-10-CM

## 2023-06-30 DIAGNOSIS — Z5111 Encounter for antineoplastic chemotherapy: Secondary | ICD-10-CM | POA: Diagnosis not present

## 2023-06-30 MED ORDER — PEGFILGRASTIM-CBQV 6 MG/0.6ML ~~LOC~~ SOSY
6.0000 mg | PREFILLED_SYRINGE | Freq: Once | SUBCUTANEOUS | Status: AC
  Administered 2023-06-30: 6 mg via SUBCUTANEOUS
  Filled 2023-06-30: qty 0.6

## 2023-07-06 NOTE — Telephone Encounter (Signed)
Patient has been scheduled. Aware of appt date and time.   Scheduling Message Entered by Rennis Harding A on 06/26/2023 at  6:19 PM Priority: Routine <No visit type provided>  Department: [facility]  Provider:  Scheduling Notes:  Labs/appt 07-24-23

## 2023-07-07 ENCOUNTER — Other Ambulatory Visit: Payer: Self-pay

## 2023-07-13 ENCOUNTER — Telehealth: Payer: Self-pay | Admitting: Dietician

## 2023-07-13 ENCOUNTER — Encounter: Payer: Self-pay | Admitting: Oncology

## 2023-07-13 ENCOUNTER — Telehealth: Payer: Self-pay

## 2023-07-13 NOTE — Telephone Encounter (Signed)
Called patient in response to request to address questions about nutrition.  Voice mail was full. Sent text with my contact information for patient to return call or schedule a nutrition consult. Gennaro Africa, RDN, LDN Registered Dietitian, [facility] Part Time Remote (Usual office hours: Tuesday-Thursday) Mobile: [phone] Remote Office: [phone]

## 2023-07-13 NOTE — Telephone Encounter (Signed)
Pt came to clinic to ask if it was ok to have dental cleaning? I told her it was ok to have dental cleaning, but no extractions, root canals, etc. She then mentioned she has had frequent tearing since the last chemo. She has tried different OTC eye gtt's without relief (allergy gtt's, irritation gtt's, and some type of cream for her eyes). Message sent to Dr. Melvyn Neth and Darl Pikes [facility].

## 2023-07-13 NOTE — Telephone Encounter (Signed)
Darl Pikes P,RPH:  Tearing is common with docetaxel. Referral to an ophthalmologist for eval is recommended if the tearing doesn't get better after treatment is stopped. They will need to check to see if her lacrimal ducts are obstructed. There are some noninvasive procedures that can be done to help with this. Usually this will gradually improve over a few months. They also need to rule out any unrelated issues such as conjunctivitis.

## 2023-07-18 ENCOUNTER — Inpatient Hospital Stay: Payer: No Typology Code available for payment source | Admitting: Dietician

## 2023-07-18 NOTE — Progress Notes (Signed)
Nutrition Assessment: Patient self referral  Reason for Assessment: New Patient Assessment   ASSESSMENT: Patient is a 57 y.o. 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   Patient completed adjuvant Taxotere/Cytoxan chemotherapy and is awaiting decision on need for radiation.  She is followed by Dr. Melvyn Neth and has a PMHx of pre-Dm, respiratory disease due to Covid, Asthma, and PNA.  Patient reports she has a 2 meal a day pattern. Many foods she used to love she doesn't tolerate at this time. She reports taste changes and may food that make her sick in her mouth.  Can't eat beans onions, cheese, cilantro, avocado.  She is able to eat bread, yogurt, pineapple, salad, tuna with mayo and crackers, fried fish, spinach, pasta, rice, potatoes Natural homemade yogurt can tolerate that 2-3 times a day.  Tangy and sour foods like sour cream and leon juice taste good to her now.  Fluids: coffee with milk & sugar, water 4-5 bottle a day, juice 3 times a week  Medications: anti emetic prescribed but not tolerated   Labs: 06/26/23  Glucose 134, Hgb 11.6   Anthropometrics:  Has lost 11# (5%) past 2 months, not yet significant for timeframe  Height: 61" Weight:  07/18/23  202.4# 06/30/23  202.3# UBW: "was much smaller" BMI: 38.21    NUTRITION DIAGNOSIS: Food and Nutrition Related Knowledge Deficit related to cancer survivorship as evidenced by no prior need for nutrition related information.   INTERVENTION:   Relayed that nutrition services are wrap around service provided at no charge and encouraged continued communication if experiencing any nutritional impact symptoms (NIS). Educated on importance of adequate nourishment with calorie and protein energy intake with nutrient dense foods when possible to maintain weight/strength and QOL.   Addressed taste changes r/t chemo and encouraged portion control for tolerated proteins  to maintain LBM. Encouraged safe weight loss (1-2#/week) if she doesn't have to have radiation and maintenance if radiation is recommended. Reviewed DM plate method.  Reviewed AICR guidelines. Contact information provided.  MONITORING, EVALUATION, GOAL: weight trends, nutrition impact symptoms, PO intake, labs   Next Visit: PRN at patient or provider request.  Gennaro Africa, RDN, LDN Registered Dietitian, [facility] Part Time Remote (Usual office hours: Tuesday-Thursday) Mobile: [phone]

## 2023-07-23 NOTE — Progress Notes (Unsigned)
[facility]  [address] [place],  [place]  [address] [phone]  Clinic Day:  07/24/2023  Referring physician: Maris Berger, MD   HISTORY OF PRESENT ILLNESS:  Kendra Collins is a 57 y.o. female withstage IA (T1c N0 M0) hormone positive breast cancer, status post a left breast lumpectomy in June 2024.   Due to having a high Oncotype score, the patient received 4 cycles of adjuvant Taxotere/Cytoxan, which were completed in October 2024.   She comes in today to be evaluated before heading into her next phase of adjuvant therapy.  She claims to still be weak from her final cycle of treatment.  She has lacrimation likely related to her 4 cycles of docetaxel, but claims it is getting slightly better.  As it pertains to her breast cancer, she denies having any findings which concern her for early disease recurrence.     PHYSICAL EXAM:  There were no vitals taken for this visit. Wt Readings from Last 3 Encounters:  06/30/23 202 lb 3.2 oz (91.7 kg)  06/28/23 205 lb (93 kg)  06/26/23 203 lb 14.4 oz (92.5 kg)   There is no height or weight on file to calculate BMI. Performance status (ECOG): 0 - Asymptomatic Physical Exam Constitutional:      Appearance: Normal appearance.  HENT:     Mouth/Throat:     Pharynx: Oropharynx is clear. No oropharyngeal exudate.  Cardiovascular:     Rate and Rhythm: Normal rate and regular rhythm.     Heart sounds: No murmur heard.    No friction rub. No gallop.  Pulmonary:     Breath sounds: Normal breath sounds.  Chest:  Breasts:    Right: No swelling, bleeding, inverted nipple, mass, nipple discharge or skin change.     Left: No swelling, bleeding, inverted nipple, mass, nipple discharge or skin change.     Comments: Her left breast has healed very well from her recent surgery. Abdominal:     General: Bowel sounds are normal. There is no distension.     Palpations: Abdomen is soft. There is no mass.      Tenderness: There is no abdominal tenderness.  Musculoskeletal:        General: No tenderness.     Cervical back: Normal range of motion and neck supple.     Right lower leg: No edema.     Left lower leg: No edema.  Lymphadenopathy:     Cervical: No cervical adenopathy.     Right cervical: No superficial, deep or posterior cervical adenopathy.    Left cervical: No superficial, deep or posterior cervical adenopathy.     Upper Body:     Right upper body: No supraclavicular or axillary adenopathy.     Left upper body: No supraclavicular or axillary adenopathy.     Lower Body: No right inguinal adenopathy. No left inguinal adenopathy.  Skin:    Coloration: Skin is not jaundiced.     Findings: No lesion or rash.  Neurological:     General: No focal deficit present.     Mental Status: She is alert and oriented to person, place, and time. Mental status is at baseline.  Psychiatric:        Mood and Affect: Mood normal.        Behavior: Behavior normal.        Thought Content: Thought content normal.        Judgment: Judgment normal.    LABS:  Latest  Reference Range & Units 07/24/23 15:48  Sodium 135 - 145 mmol/L 140  Potassium 3.5 - 5.1 mmol/L 3.9  Chloride 98 - 111 mmol/L 106  CO2 22 - 32 mmol/L 26  Glucose 70 - 99 mg/dL 948 (H)  BUN 6 - 20 mg/dL 11  Creatinine 5.46 - 2.70 mg/dL 3.50  Calcium 8.9 - 09.3 mg/dL 8.9  Anion gap 5 - 15  8  Alkaline Phosphatase 38 - 126 U/L 94  Albumin 3.5 - 5.0 g/dL 3.7  AST 15 - 41 U/L 30  ALT 0 - 44 U/L 23  Total Protein 6.5 - 8.1 g/dL 6.2 (L)  Total Bilirubin <1.2 mg/dL 0.2  GFR, Est Non African American >60 mL/min >60  WBC 4.0 - 10.5 K/uL 7.0  RBC 3.87 - 5.11 MIL/uL 3.91  Hemoglobin 12.0 - 15.0 g/dL 81.8 (L)  HCT 29.9 - 37.1 % 33.5 (L)  MCV 80.0 - 100.0 fL 85.7  MCH 26.0 - 34.0 pg 28.9  MCHC 30.0 - 36.0 g/dL 69.6  RDW 78.9 - 38.1 % 17.4 (H)  Platelets 150 - 400 K/uL 324  nRBC 0.0 - 0.2 % 0 /100 WBC 0.00 0  Neutrophils % 51   Lymphocytes % 28  Monocytes Relative % 11  Eosinophil % 9  Basophil % 1  Immature Granulocytes % 0  NEUT# 1.7 - 7.7 K/uL 3.6  Lymphs Abs 0.7 - 4.0 K/uL 2.0  Monocyte # 0.1 - 1.0 K/uL 0.8  Eosinophils Absolute 0.0 - 0.5 K/uL 0.6 (H)  Basophils Absolute 0.0 - 0.1 K/uL 0.1  Abs Immature Granulocytes 0.00 - 0.07 K/uL 0.02  (H): Data is abnormally high (L): Data is abnormally low  ASSESSMENT & PLAN:  Kendra Collins is a 57 y.o. 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  She completed her 4 cycles of Taxotere/Cytoxan in October 2024.  Based upon her clinical breast exam today, the patient remains disease free.  She will now enter the next phase of her adjuvant therapy, which will include both endocrine and radiation therapy.  I will place her on letrozole, which she will take daily x 5 years.  She has also been referred to radiation oncology so her breast radiation treatments can commence.  Moving forward, I will follow her with clinical breast exams every 4 months to ensure I do not feel anything that would suggest early disease recurrence.  I also reassured the patient her fatigue and lacrimation will continue to improve the further she gets out from her last cycle of adjuvant chemotherapy.  The patient understands all the plans discussed today and is in agreement with them.  Olisa Quesnel Kirby Funk, MD

## 2023-07-24 ENCOUNTER — Other Ambulatory Visit: Payer: Self-pay | Admitting: Oncology

## 2023-07-24 ENCOUNTER — Inpatient Hospital Stay: Payer: No Typology Code available for payment source

## 2023-07-24 ENCOUNTER — Inpatient Hospital Stay: Payer: No Typology Code available for payment source | Attending: Oncology | Admitting: Oncology

## 2023-07-24 ENCOUNTER — Encounter: Payer: Self-pay | Admitting: Oncology

## 2023-07-24 VITALS — BP 129/88 | HR 87 | Temp 97.9°F | Resp 14 | Ht 61.0 in | Wt 206.7 lb

## 2023-07-24 DIAGNOSIS — C50412 Malignant neoplasm of upper-outer quadrant of left female breast: Secondary | ICD-10-CM

## 2023-07-24 DIAGNOSIS — C50912 Malignant neoplasm of unspecified site of left female breast: Secondary | ICD-10-CM | POA: Diagnosis present

## 2023-07-24 DIAGNOSIS — Z79811 Long term (current) use of aromatase inhibitors: Secondary | ICD-10-CM | POA: Diagnosis not present

## 2023-07-24 DIAGNOSIS — Z170 Estrogen receptor positive status [ER+]: Secondary | ICD-10-CM | POA: Diagnosis not present

## 2023-07-24 LAB — CBC WITH DIFFERENTIAL (CANCER CENTER ONLY)
Abs Immature Granulocytes: 0.02 10*3/uL (ref 0.00–0.07)
Basophils Absolute: 0.1 10*3/uL (ref 0.0–0.1)
Basophils Relative: 1 %
Eosinophils Absolute: 0.6 10*3/uL — ABNORMAL HIGH (ref 0.0–0.5)
Eosinophils Relative: 9 %
HCT: 33.5 % — ABNORMAL LOW (ref 36.0–46.0)
Hemoglobin: 11.3 g/dL — ABNORMAL LOW (ref 12.0–15.0)
Immature Granulocytes: 0 %
Lymphocytes Relative: 28 %
Lymphs Abs: 2 10*3/uL (ref 0.7–4.0)
MCH: 28.9 pg (ref 26.0–34.0)
MCHC: 33.7 g/dL (ref 30.0–36.0)
MCV: 85.7 fL (ref 80.0–100.0)
Monocytes Absolute: 0.8 10*3/uL (ref 0.1–1.0)
Monocytes Relative: 11 %
Neutro Abs: 3.6 10*3/uL (ref 1.7–7.7)
Neutrophils Relative %: 51 %
Platelet Count: 324 10*3/uL (ref 150–400)
RBC: 3.91 MIL/uL (ref 3.87–5.11)
RDW: 17.4 % — ABNORMAL HIGH (ref 11.5–15.5)
WBC Count: 7 10*3/uL (ref 4.0–10.5)
nRBC: 0 % (ref 0.0–0.2)
nRBC: 0 /100{WBCs}

## 2023-07-24 LAB — CMP (CANCER CENTER ONLY)
ALT: 23 U/L (ref 0–44)
AST: 30 U/L (ref 15–41)
Albumin: 3.7 g/dL (ref 3.5–5.0)
Alkaline Phosphatase: 94 U/L (ref 38–126)
Anion gap: 8 (ref 5–15)
BUN: 11 mg/dL (ref 6–20)
CO2: 26 mmol/L (ref 22–32)
Calcium: 8.9 mg/dL (ref 8.9–10.3)
Chloride: 106 mmol/L (ref 98–111)
Creatinine: 0.62 mg/dL (ref 0.44–1.00)
GFR, Estimated: >60 mL/min (ref >60)
Glucose, Bld: 113 mg/dL — ABNORMAL HIGH (ref 70–99)
Potassium: 3.9 mmol/L (ref 3.5–5.1)
Sodium: 140 mmol/L (ref 135–145)
Total Bilirubin: 0.2 mg/dL (ref <1.2)
Total Protein: 6.2 g/dL — ABNORMAL LOW (ref 6.5–8.1)

## 2023-07-24 MED ORDER — LETROZOLE 2.5 MG PO TABS: 2.5000 mg | ORAL_TABLET | Freq: Every day | ORAL | 3 refills | Status: DC

## 2023-07-27 ENCOUNTER — Other Ambulatory Visit: Payer: Self-pay

## 2023-07-31 ENCOUNTER — Other Ambulatory Visit: Payer: Self-pay

## 2023-08-01 ENCOUNTER — Other Ambulatory Visit: Payer: Self-pay

## 2023-08-02 ENCOUNTER — Ambulatory Visit
Admission: RE | Admit: 2023-08-02 | Discharge: 2023-08-02 | Disposition: A | Discharge status: Home / Self Care | Payer: No Typology Code available for payment source | Source: Ambulatory Visit | Attending: Radiation Oncology | Admitting: Radiation Oncology

## 2023-08-02 ENCOUNTER — Encounter: Payer: Self-pay | Admitting: Radiation Oncology

## 2023-08-02 VITALS — BP 118/90 | HR 81 | Temp 98.7°F | Resp 16 | Ht 61.0 in | Wt 204.6 lb

## 2023-08-02 DIAGNOSIS — Z170 Estrogen receptor positive status [ER+]: Secondary | ICD-10-CM

## 2023-08-02 NOTE — Progress Notes (Signed)
Face to face contact with pt in exam room. Pt is here for Rad Onc consult. Pt has completed her chemo and reports that she is beginning to feel better. She still has some tearing from one eye and her PMD has placed her on allergy drops to treat it. Encouraged pt to call with questions or concerns.

## 2023-08-02 NOTE — Progress Notes (Signed)
Radiation Oncology         [phone] ________________________________  Name: Kendra Collins        MRN: [id number]  Date of Service: 08/02/2023 DOB: 10/28/1965  QQ:[id number], Lowell Guitar, MD  Maris Berger, MD     REFERRING PHYSICIAN: Maris Berger, MD   DIAGNOSIS: Invasive carcinoma of the left breast, pT1cpN0, status post lumpectomy/sentinel lymph node dissection, followed by adjuvant chemotherapy   HISTORY OF PRESENT ILLNESS: Kendra Collins is a 57 y.o. female seen at the request of Dr. Melvyn Neth.  Earlier this year, she developed discomfort involving her left breast, and ultimately underwent mammography, revealing a 1.2 cm abnormality in the left breast.  Biopsy was subsequently performed, revealing invasive ductal carcinoma.  She was seen in consultation by Dr. Marvis Moeller, and underwent left breast lumpectomy in June.  Pathology revealed invasive ductal carcinoma, grade 2, measuring 16 mm in greatest dimension..  Her carcinoma was ER positive at 95%, PR positive at 15%, and HER2 negative.  She was seen in consultation by Dr. Melvyn Neth, and underwent Oncotype testing: She subsequently received adjuvant chemotherapy with Taxotere and Cytoxan.  She completed chemotherapy last month, and has been started on adjuvant endocrine therapy.  Consultation is requested regarding the potential role of radiation in her care.    PREVIOUS RADIATION THERAPY: No   PAST MEDICAL HISTORY:  Past Medical History:  Diagnosis Date   Anxiety    Asthma    Breast cancer (HCC)    Depression    GERD (gastroesophageal reflux disease)        PAST SURGICAL HISTORY: Past Surgical History:  Procedure Laterality Date   ABDOMINAL HYSTERECTOMY  2016   PARTIAL - PT STILL HAS OVARIES   BIOPSY BREAST Left    2018   BREAST BIOPSY Left    2024   BREAST LUMPECTOMY Left    2024   LYMPH NODE BIOPSY Left    AXILLARY 2024   TUBAL LIGATION  2006     FAMILY HISTORY:  Family History  Problem Relation Age of  Onset   Colitis Mother    Osteoporosis Mother    CVA Father    Heart disease Father    Heart attack Father    Hyperlipidemia Father    Colitis Sister    Heart disease Brother    Osteoporosis Brother      SOCIAL HISTORY:  reports that she quit smoking about 15 years ago. Her smoking use included cigarettes. She has never used smokeless tobacco. She reports that she does not drink alcohol and does not use drugs.   ALLERGIES: Ibuprofen, Aspirin, Penicillin g, and Sulfamethoxazole-trimethoprim   MEDICATIONS:  Current Outpatient Medications  Medication Sig Dispense Refill   ANUSOL-HC 2.5 % rectal cream Apply 1 application twice a day by topical route as needed for 30 days.     cetirizine (ZYRTEC) 10 MG tablet 1 tablet at bedtime as needed  for allergies.     pantoprazole (PROTONIX) 40 MG tablet Take 1 tablet every day by oral route for 90 days, for acid reflux.     acetaminophen (TYLENOL) 325 MG tablet Take 650 mg by mouth every 6 (six) hours as needed.     albuterol (VENTOLIN HFA) 108 (90 Base) MCG/ACT inhaler Inhale 2 puffs into the lungs every 4 (four) hours as needed for shortness of breath or wheezing. (Patient not taking: Reported on 04/08/2020)     dexamethasone (DECADRON) 4 MG tablet Take 2 tabs by mouth 2 times daily starting day  before chemo. Then take 2 tabs daily for 2 days starting day after chemo. Take with food. 30 tablet 1   hydrocortisone 2.5 % cream Apply 1 application twice a day by topical route as needed for 30 days.     letrozole (FEMARA) 2.5 MG tablet Take 1 tablet (2.5 mg total) by mouth daily. 90 tablet 3   lidocaine-prilocaine (EMLA) cream Apply to affected area once 30 g 3   magic mouthwash (nystatin, diphenhydrAMINE, alum & mag hydroxide) suspension mixture Swish and spit 5 mLs 4 (four) times daily. 240 mL 0   omeprazole (PRILOSEC OTC) 20 MG tablet Take 20 mg by mouth daily as needed (acid reflux).     ondansetron (ZOFRAN) 8 MG tablet Take 1 tablet (8 mg total)  by mouth every 8 (eight) hours as needed for nausea or vomiting. Start on the third day after chemotherapy. 30 tablet 1   prochlorperazine (COMPAZINE) 10 MG tablet Take 1 tablet (10 mg total) by mouth every 6 (six) hours as needed for nausea or vomiting. 30 tablet 1   No current facility-administered medications for this encounter.     REVIEW OF SYSTEMS: On review of systems, the patient reports that she is doing well overall.  She denies any chest pain, shortness of breath, cough, fevers, chills, night sweats, unintended weight changes.  She denies any bowel or bladder disturbances, and denies abdominal pain, nausea or vomiting.  She denies any new musculoskeletal or joint aches or pains.  She reports no pain in either breast.  She remains mildly fatigued.  She is bothered by neuropathy in her fingers and toes.  A complete review of systems is obtained and is otherwise negative.     PHYSICAL EXAM:  Wt Readings from Last 3 Encounters:  08/02/23 204 lb 9.6 oz (92.8 kg)  07/24/23 206 lb 11.2 oz (93.8 kg)  06/30/23 202 lb 3.2 oz (91.7 kg)   Temp Readings from Last 3 Encounters:  08/02/23 98.7 F (37.1 C) (Oral)  07/24/23 97.9 F (36.6 C)  06/28/23 98.6 F (37 C) (Oral)   BP Readings from Last 3 Encounters:  08/02/23 (!) 118/90  07/24/23 129/88  06/28/23 (!) 169/84   Pulse Readings from Last 3 Encounters:  08/02/23 81  07/24/23 87  06/28/23 85   Pain Assessment Pain Score: 3  Pain Loc: Generalized/10  In general this is a well appearing she in no acute distress.  She's alert and oriented x4 and appropriate throughout the examination. Cardiopulmonary assessment is negative for acute distress and she exhibits normal effort.  Examination of her right breast reveals no abnormalities.  Examination of her left breast reveals a well-healed lumpectomy incision.  No left breast tenderness is appreciated.  No cervical, supraclavicular, or axillary lymphadenopathy is appreciated.    ECOG  = 0  0 - Asymptomatic (Fully active, able to carry on all predisease activities without restriction)  1 - Symptomatic but completely ambulatory (Restricted in physically strenuous activity but ambulatory and able to carry out work of a light or sedentary nature. For example, light housework, office work)  2 - Symptomatic, <50% in bed during the day (Ambulatory and capable of all self care but unable to carry out any work activities. Up and about more than 50% of waking hours)  3 - Symptomatic, >50% in bed, but not bedbound (Capable of only limited self-care, confined to bed or chair 50% or more of waking hours)  4 - Bedbound (Completely disabled. Cannot carry on any self-care. Totally  confined to bed or chair)  5 - Death   Santiago Glad MM, Creech RH, Tormey DC, et al. [phone]). "Toxicity and response criteria of the [facility]". Am. Evlyn Clines. Oncol. 5 (6): 649-55    LABORATORY DATA:  Lab Results  Component Value Date   WBC 7.0 07/24/2023   HGB 11.3 (L) 07/24/2023   HCT 33.5 (L) 07/24/2023   MCV 85.7 07/24/2023   PLT 324 07/24/2023   Lab Results  Component Value Date   NA 140 07/24/2023   K 3.9 07/24/2023   CL 106 07/24/2023   CO2 26 07/24/2023   Lab Results  Component Value Date   ALT 23 07/24/2023   AST 30 07/24/2023   ALKPHOS 94 07/24/2023   BILITOT 0.2 07/24/2023         IMPRESSION/PLAN: 1.  The patient is a 57 year old female status post left breast lumpectomy, followed by adjuvant chemotherapy, for a pT1cpN0 invasive ductal carcinoma.  I spoke at length with her regarding the role of radiation in the management of postlumpectomy breast carcinoma.  I have advised that we proceed with adjuvant breast radiation; I explained the rationale behind this recommendation, as well as the potential side effects of radiation in her clinical scenario.  I explained that these may include, but are not limited to, skin irritation/breakdown, fatigue, and potential  long-term risk of injury to lung, heart, and other normal tissues.  She expressed understanding, and is agreeable to proceed.  We will make arrangements for her to return to our department for simulation and treatment planning.  In a visit lasting 60 minutes, greater than 50% of the time was spent face to face discussing the patient's condition, in preparation for the discussion, and coordinating the patient's care.    Prescious Hurless A. Thersa Salt, MD   **Disclaimer: This note was dictated with voice recognition software. Similar sounding words can inadvertently be transcribed and this note may contain transcription errors which may not have been corrected upon publication of note.**

## 2023-08-03 ENCOUNTER — Encounter: Payer: Self-pay | Admitting: Oncology

## 2023-08-09 ENCOUNTER — Encounter: Payer: Self-pay | Admitting: Diagnostic Radiology

## 2023-08-11 ENCOUNTER — Ambulatory Visit
Admission: RE | Admit: 2023-08-11 | Discharge: 2023-08-11 | Disposition: A | Discharge status: Home / Self Care | Payer: No Typology Code available for payment source | Source: Ambulatory Visit | Attending: Radiation Oncology | Admitting: Radiation Oncology

## 2023-08-11 DIAGNOSIS — C50912 Malignant neoplasm of unspecified site of left female breast: Secondary | ICD-10-CM | POA: Insufficient documentation

## 2023-08-11 DIAGNOSIS — Z170 Estrogen receptor positive status [ER+]: Secondary | ICD-10-CM | POA: Insufficient documentation

## 2023-08-11 DIAGNOSIS — C50412 Malignant neoplasm of upper-outer quadrant of left female breast: Secondary | ICD-10-CM | POA: Insufficient documentation

## 2023-08-11 DIAGNOSIS — Z510 Encounter for antineoplastic radiation therapy: Secondary | ICD-10-CM | POA: Diagnosis present

## 2023-08-16 DIAGNOSIS — Z510 Encounter for antineoplastic radiation therapy: Secondary | ICD-10-CM | POA: Diagnosis not present

## 2023-08-21 ENCOUNTER — Other Ambulatory Visit: Payer: Self-pay

## 2023-08-21 ENCOUNTER — Ambulatory Visit
Admission: RE | Admit: 2023-08-21 | Discharge: 2023-08-21 | Disposition: A | Discharge status: Home / Self Care | Payer: No Typology Code available for payment source | Source: Ambulatory Visit | Attending: Radiation Oncology | Admitting: Radiation Oncology

## 2023-08-21 DIAGNOSIS — Z510 Encounter for antineoplastic radiation therapy: Secondary | ICD-10-CM | POA: Diagnosis present

## 2023-08-21 DIAGNOSIS — Z170 Estrogen receptor positive status [ER+]: Secondary | ICD-10-CM | POA: Insufficient documentation

## 2023-08-21 DIAGNOSIS — C50412 Malignant neoplasm of upper-outer quadrant of left female breast: Secondary | ICD-10-CM | POA: Insufficient documentation

## 2023-08-21 LAB — RAD ONC ARIA SESSION SUMMARY
Course Elapsed Days: 0
Plan Fractions Treated to Date: 1
Plan Prescribed Dose Per Fraction: 2.66 Gy
Plan Total Fractions Prescribed: 16
Plan Total Prescribed Dose: 42.56 Gy
Reference Point Dosage Given to Date: 2.66 Gy
Reference Point Session Dosage Given: 2.66 Gy
Session Number: 1

## 2023-08-22 ENCOUNTER — Other Ambulatory Visit: Payer: Self-pay

## 2023-08-22 ENCOUNTER — Ambulatory Visit
Admission: RE | Admit: 2023-08-22 | Discharge: 2023-08-22 | Disposition: A | Discharge status: Home / Self Care | Payer: No Typology Code available for payment source | Source: Ambulatory Visit | Attending: Radiation Oncology | Admitting: Radiation Oncology

## 2023-08-22 ENCOUNTER — Ambulatory Visit: Payer: No Typology Code available for payment source | Admitting: Radiation Oncology

## 2023-08-22 DIAGNOSIS — Z510 Encounter for antineoplastic radiation therapy: Secondary | ICD-10-CM | POA: Diagnosis not present

## 2023-08-22 LAB — RAD ONC ARIA SESSION SUMMARY
Course Elapsed Days: 1
Plan Fractions Treated to Date: 2
Plan Prescribed Dose Per Fraction: 2.66 Gy
Plan Total Fractions Prescribed: 16
Plan Total Prescribed Dose: 42.56 Gy
Reference Point Dosage Given to Date: 5.32 Gy
Reference Point Session Dosage Given: 2.66 Gy
Session Number: 2

## 2023-08-23 ENCOUNTER — Ambulatory Visit
Admission: RE | Admit: 2023-08-23 | Discharge: 2023-08-23 | Disposition: A | Discharge status: Home / Self Care | Payer: No Typology Code available for payment source | Source: Ambulatory Visit | Attending: Radiation Oncology

## 2023-08-23 ENCOUNTER — Other Ambulatory Visit: Payer: Self-pay

## 2023-08-23 DIAGNOSIS — Z510 Encounter for antineoplastic radiation therapy: Secondary | ICD-10-CM | POA: Diagnosis not present

## 2023-08-23 LAB — RAD ONC ARIA SESSION SUMMARY
Course Elapsed Days: 2
Plan Fractions Treated to Date: 3
Plan Prescribed Dose Per Fraction: 2.66 Gy
Plan Total Fractions Prescribed: 16
Plan Total Prescribed Dose: 42.56 Gy
Reference Point Dosage Given to Date: 7.98 Gy
Reference Point Session Dosage Given: 2.66 Gy
Session Number: 3

## 2023-08-24 ENCOUNTER — Ambulatory Visit
Admission: RE | Admit: 2023-08-24 | Discharge: 2023-08-24 | Disposition: A | Discharge status: Home / Self Care | Payer: No Typology Code available for payment source | Source: Ambulatory Visit | Attending: Radiation Oncology | Admitting: Radiation Oncology

## 2023-08-24 ENCOUNTER — Other Ambulatory Visit: Payer: Self-pay

## 2023-08-24 DIAGNOSIS — Z510 Encounter for antineoplastic radiation therapy: Secondary | ICD-10-CM | POA: Diagnosis not present

## 2023-08-24 LAB — RAD ONC ARIA SESSION SUMMARY
Course Elapsed Days: 3
Plan Fractions Treated to Date: 4
Plan Prescribed Dose Per Fraction: 2.66 Gy
Plan Total Fractions Prescribed: 16
Plan Total Prescribed Dose: 42.56 Gy
Reference Point Dosage Given to Date: 10.64 Gy
Reference Point Session Dosage Given: 2.66 Gy
Session Number: 4

## 2023-08-25 ENCOUNTER — Ambulatory Visit
Admission: RE | Admit: 2023-08-25 | Discharge: 2023-08-25 | Disposition: A | Discharge status: Home / Self Care | Payer: No Typology Code available for payment source | Source: Ambulatory Visit | Attending: Radiation Oncology | Admitting: Radiation Oncology

## 2023-08-25 ENCOUNTER — Other Ambulatory Visit: Payer: Self-pay

## 2023-08-25 DIAGNOSIS — Z510 Encounter for antineoplastic radiation therapy: Secondary | ICD-10-CM | POA: Diagnosis not present

## 2023-08-25 LAB — RAD ONC ARIA SESSION SUMMARY
Course Elapsed Days: 4
Plan Fractions Treated to Date: 5
Plan Prescribed Dose Per Fraction: 2.66 Gy
Plan Total Fractions Prescribed: 16
Plan Total Prescribed Dose: 42.56 Gy
Reference Point Dosage Given to Date: 13.3 Gy
Reference Point Session Dosage Given: 2.66 Gy
Session Number: 5

## 2023-08-28 ENCOUNTER — Other Ambulatory Visit: Payer: Self-pay

## 2023-08-28 ENCOUNTER — Ambulatory Visit
Admission: RE | Admit: 2023-08-28 | Discharge: 2023-08-28 | Disposition: A | Discharge status: Home / Self Care | Payer: No Typology Code available for payment source | Source: Ambulatory Visit | Attending: Radiation Oncology

## 2023-08-28 DIAGNOSIS — Z510 Encounter for antineoplastic radiation therapy: Secondary | ICD-10-CM | POA: Diagnosis not present

## 2023-08-28 LAB — RAD ONC ARIA SESSION SUMMARY
Course Elapsed Days: 7
Plan Fractions Treated to Date: 6
Plan Prescribed Dose Per Fraction: 2.66 Gy
Plan Total Fractions Prescribed: 16
Plan Total Prescribed Dose: 42.56 Gy
Reference Point Dosage Given to Date: 15.96 Gy
Reference Point Session Dosage Given: 2.66 Gy
Session Number: 6

## 2023-08-29 ENCOUNTER — Ambulatory Visit
Admission: RE | Admit: 2023-08-29 | Discharge: 2023-08-29 | Disposition: A | Discharge status: Home / Self Care | Payer: No Typology Code available for payment source | Source: Ambulatory Visit | Attending: Radiation Oncology | Admitting: Radiation Oncology

## 2023-08-29 ENCOUNTER — Other Ambulatory Visit: Payer: Self-pay

## 2023-08-29 DIAGNOSIS — Z510 Encounter for antineoplastic radiation therapy: Secondary | ICD-10-CM | POA: Diagnosis not present

## 2023-08-29 LAB — RAD ONC ARIA SESSION SUMMARY
Course Elapsed Days: 8
Plan Fractions Treated to Date: 7
Plan Prescribed Dose Per Fraction: 2.66 Gy
Plan Total Fractions Prescribed: 16
Plan Total Prescribed Dose: 42.56 Gy
Reference Point Dosage Given to Date: 18.62 Gy
Reference Point Session Dosage Given: 2.66 Gy
Session Number: 7

## 2023-08-30 ENCOUNTER — Other Ambulatory Visit: Payer: Self-pay

## 2023-08-30 ENCOUNTER — Ambulatory Visit
Admission: RE | Admit: 2023-08-30 | Discharge: 2023-08-30 | Disposition: A | Discharge status: Home / Self Care | Payer: No Typology Code available for payment source | Source: Ambulatory Visit | Attending: Radiation Oncology | Admitting: Radiation Oncology

## 2023-08-30 DIAGNOSIS — Z510 Encounter for antineoplastic radiation therapy: Secondary | ICD-10-CM | POA: Diagnosis not present

## 2023-08-30 LAB — RAD ONC ARIA SESSION SUMMARY
Course Elapsed Days: 9
Plan Fractions Treated to Date: 8
Plan Prescribed Dose Per Fraction: 2.66 Gy
Plan Total Fractions Prescribed: 16
Plan Total Prescribed Dose: 42.56 Gy
Reference Point Dosage Given to Date: 21.28 Gy
Reference Point Session Dosage Given: 2.66 Gy
Session Number: 8

## 2023-08-31 ENCOUNTER — Other Ambulatory Visit: Payer: Self-pay

## 2023-08-31 ENCOUNTER — Ambulatory Visit
Admission: RE | Admit: 2023-08-31 | Discharge: 2023-08-31 | Disposition: A | Discharge status: Home / Self Care | Payer: No Typology Code available for payment source | Source: Ambulatory Visit | Attending: Radiation Oncology | Admitting: Radiation Oncology

## 2023-08-31 DIAGNOSIS — Z510 Encounter for antineoplastic radiation therapy: Secondary | ICD-10-CM | POA: Diagnosis not present

## 2023-08-31 LAB — RAD ONC ARIA SESSION SUMMARY
Course Elapsed Days: 10
Plan Fractions Treated to Date: 9
Plan Prescribed Dose Per Fraction: 2.66 Gy
Plan Total Fractions Prescribed: 16
Plan Total Prescribed Dose: 42.56 Gy
Reference Point Dosage Given to Date: 23.94 Gy
Reference Point Session Dosage Given: 2.66 Gy
Session Number: 9

## 2023-09-01 ENCOUNTER — Other Ambulatory Visit: Payer: Self-pay

## 2023-09-01 ENCOUNTER — Ambulatory Visit
Admission: RE | Admit: 2023-09-01 | Discharge: 2023-09-01 | Disposition: A | Discharge status: Home / Self Care | Payer: No Typology Code available for payment source | Source: Ambulatory Visit | Attending: Radiation Oncology | Admitting: Radiation Oncology

## 2023-09-01 DIAGNOSIS — Z510 Encounter for antineoplastic radiation therapy: Secondary | ICD-10-CM | POA: Diagnosis not present

## 2023-09-01 LAB — RAD ONC ARIA SESSION SUMMARY
Course Elapsed Days: 11
Plan Fractions Treated to Date: 10
Plan Prescribed Dose Per Fraction: 2.66 Gy
Plan Total Fractions Prescribed: 16
Plan Total Prescribed Dose: 42.56 Gy
Reference Point Dosage Given to Date: 26.6 Gy
Reference Point Session Dosage Given: 2.66 Gy
Session Number: 10

## 2023-09-04 ENCOUNTER — Ambulatory Visit
Admission: RE | Admit: 2023-09-04 | Discharge: 2023-09-04 | Disposition: A | Discharge status: Home / Self Care | Payer: No Typology Code available for payment source | Source: Ambulatory Visit | Attending: Radiation Oncology

## 2023-09-04 ENCOUNTER — Other Ambulatory Visit: Payer: Self-pay

## 2023-09-04 DIAGNOSIS — Z510 Encounter for antineoplastic radiation therapy: Secondary | ICD-10-CM | POA: Diagnosis not present

## 2023-09-04 LAB — RAD ONC ARIA SESSION SUMMARY
Course Elapsed Days: 14
Plan Fractions Treated to Date: 11
Plan Prescribed Dose Per Fraction: 2.66 Gy
Plan Total Fractions Prescribed: 16
Plan Total Prescribed Dose: 42.56 Gy
Reference Point Dosage Given to Date: 29.26 Gy
Reference Point Session Dosage Given: 2.66 Gy
Session Number: 11

## 2023-09-05 ENCOUNTER — Ambulatory Visit
Admission: RE | Admit: 2023-09-05 | Discharge: 2023-09-05 | Disposition: A | Discharge status: Home / Self Care | Payer: No Typology Code available for payment source | Source: Ambulatory Visit | Attending: Radiation Oncology | Admitting: Radiation Oncology

## 2023-09-05 ENCOUNTER — Other Ambulatory Visit: Payer: Self-pay

## 2023-09-05 DIAGNOSIS — Z510 Encounter for antineoplastic radiation therapy: Secondary | ICD-10-CM | POA: Diagnosis not present

## 2023-09-05 LAB — RAD ONC ARIA SESSION SUMMARY
Course Elapsed Days: 15
Plan Fractions Treated to Date: 12
Plan Prescribed Dose Per Fraction: 2.66 Gy
Plan Total Fractions Prescribed: 16
Plan Total Prescribed Dose: 42.56 Gy
Reference Point Dosage Given to Date: 31.92 Gy
Reference Point Session Dosage Given: 2.66 Gy
Session Number: 12

## 2023-09-06 ENCOUNTER — Other Ambulatory Visit: Payer: Self-pay

## 2023-09-06 ENCOUNTER — Ambulatory Visit
Admission: RE | Admit: 2023-09-06 | Discharge: 2023-09-06 | Disposition: A | Discharge status: Home / Self Care | Payer: No Typology Code available for payment source | Source: Ambulatory Visit | Attending: Radiation Oncology | Admitting: Radiation Oncology

## 2023-09-06 DIAGNOSIS — Z510 Encounter for antineoplastic radiation therapy: Secondary | ICD-10-CM | POA: Diagnosis not present

## 2023-09-06 LAB — RAD ONC ARIA SESSION SUMMARY
Course Elapsed Days: 16
Plan Fractions Treated to Date: 13
Plan Prescribed Dose Per Fraction: 2.66 Gy
Plan Total Fractions Prescribed: 16
Plan Total Prescribed Dose: 42.56 Gy
Reference Point Dosage Given to Date: 34.58 Gy
Reference Point Session Dosage Given: 2.66 Gy
Session Number: 13

## 2023-09-07 ENCOUNTER — Ambulatory Visit
Admission: RE | Admit: 2023-09-07 | Discharge: 2023-09-07 | Disposition: A | Discharge status: Home / Self Care | Payer: No Typology Code available for payment source | Source: Ambulatory Visit | Attending: Radiation Oncology | Admitting: Radiation Oncology

## 2023-09-07 ENCOUNTER — Other Ambulatory Visit: Payer: Self-pay

## 2023-09-07 DIAGNOSIS — Z510 Encounter for antineoplastic radiation therapy: Secondary | ICD-10-CM | POA: Diagnosis not present

## 2023-09-07 LAB — RAD ONC ARIA SESSION SUMMARY
Course Elapsed Days: 17
Plan Fractions Treated to Date: 14
Plan Prescribed Dose Per Fraction: 2.66 Gy
Plan Total Fractions Prescribed: 16
Plan Total Prescribed Dose: 42.56 Gy
Reference Point Dosage Given to Date: 37.24 Gy
Reference Point Session Dosage Given: 2.66 Gy
Session Number: 14

## 2023-09-08 ENCOUNTER — Ambulatory Visit
Admission: RE | Admit: 2023-09-08 | Discharge: 2023-09-08 | Disposition: A | Discharge status: Home / Self Care | Payer: No Typology Code available for payment source | Source: Ambulatory Visit | Attending: Radiation Oncology | Admitting: Radiation Oncology

## 2023-09-08 ENCOUNTER — Other Ambulatory Visit: Payer: Self-pay

## 2023-09-08 DIAGNOSIS — Z510 Encounter for antineoplastic radiation therapy: Secondary | ICD-10-CM | POA: Diagnosis not present

## 2023-09-08 LAB — RAD ONC ARIA SESSION SUMMARY
Course Elapsed Days: 18
Plan Fractions Treated to Date: 15
Plan Prescribed Dose Per Fraction: 2.66 Gy
Plan Total Fractions Prescribed: 16
Plan Total Prescribed Dose: 42.56 Gy
Reference Point Dosage Given to Date: 39.9 Gy
Reference Point Session Dosage Given: 2.66 Gy
Session Number: 15

## 2023-09-11 ENCOUNTER — Ambulatory Visit: Payer: No Typology Code available for payment source | Admitting: Radiation Oncology

## 2023-09-11 ENCOUNTER — Ambulatory Visit
Admission: RE | Admit: 2023-09-11 | Discharge: 2023-09-11 | Disposition: A | Discharge status: Home / Self Care | Payer: No Typology Code available for payment source | Source: Ambulatory Visit | Attending: Radiation Oncology

## 2023-09-11 ENCOUNTER — Other Ambulatory Visit: Payer: Self-pay

## 2023-09-11 DIAGNOSIS — Z510 Encounter for antineoplastic radiation therapy: Secondary | ICD-10-CM | POA: Diagnosis not present

## 2023-09-11 LAB — RAD ONC ARIA SESSION SUMMARY
Course Elapsed Days: 21
Plan Fractions Treated to Date: 16
Plan Prescribed Dose Per Fraction: 2.66 Gy
Plan Total Fractions Prescribed: 16
Plan Total Prescribed Dose: 42.56 Gy
Reference Point Dosage Given to Date: 42.56 Gy
Reference Point Session Dosage Given: 2.66 Gy
Session Number: 16

## 2023-09-12 ENCOUNTER — Ambulatory Visit: Payer: No Typology Code available for payment source | Admitting: Radiation Oncology

## 2023-09-14 ENCOUNTER — Other Ambulatory Visit: Payer: Self-pay

## 2023-09-14 ENCOUNTER — Ambulatory Visit: Payer: No Typology Code available for payment source

## 2023-09-14 DIAGNOSIS — Z510 Encounter for antineoplastic radiation therapy: Secondary | ICD-10-CM | POA: Diagnosis not present

## 2023-09-14 LAB — RAD ONC ARIA SESSION SUMMARY
Course Elapsed Days: 24
Plan Fractions Treated to Date: 1
Plan Prescribed Dose Per Fraction: 2 Gy
Plan Total Fractions Prescribed: 5
Plan Total Prescribed Dose: 10 Gy
Reference Point Dosage Given to Date: 2 Gy
Reference Point Session Dosage Given: 2 Gy
Session Number: 17

## 2023-09-15 ENCOUNTER — Other Ambulatory Visit: Payer: Self-pay

## 2023-09-15 ENCOUNTER — Ambulatory Visit
Admission: RE | Admit: 2023-09-15 | Discharge: 2023-09-15 | Disposition: A | Discharge status: Home / Self Care | Payer: No Typology Code available for payment source | Source: Ambulatory Visit | Attending: Radiation Oncology

## 2023-09-15 DIAGNOSIS — Z510 Encounter for antineoplastic radiation therapy: Secondary | ICD-10-CM | POA: Diagnosis not present

## 2023-09-15 LAB — RAD ONC ARIA SESSION SUMMARY
Course Elapsed Days: 25
Plan Fractions Treated to Date: 2
Plan Prescribed Dose Per Fraction: 2 Gy
Plan Total Fractions Prescribed: 5
Plan Total Prescribed Dose: 10 Gy
Reference Point Dosage Given to Date: 4 Gy
Reference Point Session Dosage Given: 2 Gy
Session Number: 18

## 2023-09-18 ENCOUNTER — Other Ambulatory Visit: Payer: Self-pay

## 2023-09-18 ENCOUNTER — Ambulatory Visit: Payer: No Typology Code available for payment source

## 2023-09-18 DIAGNOSIS — Z510 Encounter for antineoplastic radiation therapy: Secondary | ICD-10-CM | POA: Diagnosis not present

## 2023-09-18 LAB — RAD ONC ARIA SESSION SUMMARY
Course Elapsed Days: 28
Plan Fractions Treated to Date: 3
Plan Prescribed Dose Per Fraction: 2 Gy
Plan Total Fractions Prescribed: 5
Plan Total Prescribed Dose: 10 Gy
Reference Point Dosage Given to Date: 6 Gy
Reference Point Session Dosage Given: 2 Gy
Session Number: 19

## 2023-09-19 ENCOUNTER — Other Ambulatory Visit: Payer: Self-pay

## 2023-09-19 ENCOUNTER — Ambulatory Visit
Admission: RE | Admit: 2023-09-19 | Discharge: 2023-09-19 | Disposition: A | Discharge status: Home / Self Care | Payer: No Typology Code available for payment source | Source: Ambulatory Visit | Attending: Radiation Oncology | Admitting: Radiation Oncology

## 2023-09-19 ENCOUNTER — Ambulatory Visit: Payer: No Typology Code available for payment source

## 2023-09-19 DIAGNOSIS — Z510 Encounter for antineoplastic radiation therapy: Secondary | ICD-10-CM | POA: Diagnosis not present

## 2023-09-19 LAB — RAD ONC ARIA SESSION SUMMARY
Course Elapsed Days: 29
Plan Fractions Treated to Date: 4
Plan Prescribed Dose Per Fraction: 2 Gy
Plan Total Fractions Prescribed: 5
Plan Total Prescribed Dose: 10 Gy
Reference Point Dosage Given to Date: 8 Gy
Reference Point Session Dosage Given: 2 Gy
Session Number: 20

## 2023-09-21 ENCOUNTER — Ambulatory Visit
Admission: RE | Admit: 2023-09-21 | Discharge: 2023-09-21 | Disposition: A | Discharge status: Home / Self Care | Payer: No Typology Code available for payment source | Source: Ambulatory Visit | Attending: Radiation Oncology | Admitting: Radiation Oncology

## 2023-09-21 ENCOUNTER — Other Ambulatory Visit: Payer: Self-pay

## 2023-09-21 DIAGNOSIS — Z510 Encounter for antineoplastic radiation therapy: Secondary | ICD-10-CM | POA: Diagnosis present

## 2023-09-21 DIAGNOSIS — C50412 Malignant neoplasm of upper-outer quadrant of left female breast: Secondary | ICD-10-CM | POA: Insufficient documentation

## 2023-09-21 DIAGNOSIS — Z170 Estrogen receptor positive status [ER+]: Secondary | ICD-10-CM | POA: Insufficient documentation

## 2023-09-21 LAB — RAD ONC ARIA SESSION SUMMARY
Course Elapsed Days: 31
Plan Fractions Treated to Date: 5
Plan Prescribed Dose Per Fraction: 2 Gy
Plan Total Fractions Prescribed: 5
Plan Total Prescribed Dose: 10 Gy
Reference Point Dosage Given to Date: 10 Gy
Reference Point Session Dosage Given: 2 Gy
Session Number: 21

## 2023-09-22 NOTE — Radiation Completion Notes (Addendum)
 Patient Name: Kendra Collins, Kendra Collins MRN: [id number] Date of Birth: 24-Nov-1965 Referring Physician: LAURITA ANO, M.D. Date of Service: 2023-09-22 Radiation Oncologist: Lynwood Cedar, M.D. [facility]                             RADIATION ONCOLOGY END OF TREATMENT NOTE     Diagnosis: C50.412 Malignant neoplasm of upper-outer quadrant of left female breast Staging on 2023-03-14: Breast cancer (HCC) T=pT1c, N=pN0, M=cM0 Intent: Curative     ==========DELIVERED PLANS==========  First Treatment Date: 2023-08-21 Last Treatment Date: 2023-09-21   Plan Name: breast_L_BH Site: Breast, Left Technique: 3D Mode: Photon Dose Per Fraction: 2.66 Gy Prescribed Dose (Delivered / Prescribed): 42.56 Gy / 42.56 Gy Prescribed Fxs (Delivered / Prescribed): 16 / 16   Plan Name: Brst_L_Bst_BH Site: Breast, Left Technique: Isodose Plan Mode: Photon Dose Per Fraction: 2 Gy Prescribed Dose (Delivered / Prescribed): 10 Gy / 10 Gy Prescribed Fxs (Delivered / Prescribed): 5 / 5     ==========ON TREATMENT VISIT DATES========== 2023-08-22, 2023-08-29, 2023-09-05, 2023-09-11, 2023-09-19     ==========UPCOMING VISITS========== 11/21/2023 [facility] MED ONC EST PT 15 Lewis, Dequincy A, MD  10/19/2023 [facility] RAD ONC FOLLOW UP 15 Cedar Lynwood, MD        ==========APPENDIX - ON TREATMENT VISIT NOTES==========   See weekly On Treatment Notes in Epic for details in the Media tab (listed as Progress notes on the On Treatment Visit Dates listed above).

## 2023-10-04 ENCOUNTER — Encounter: Payer: Self-pay | Admitting: Oncology

## 2023-10-05 ENCOUNTER — Ambulatory Visit
Admission: RE | Admit: 2023-10-05 | Discharge: 2023-10-05 | Disposition: A | Discharge status: Home / Self Care | Payer: No Typology Code available for payment source | Source: Ambulatory Visit | Attending: Radiation Oncology | Admitting: Radiation Oncology

## 2023-10-05 VITALS — BP 157/87 | HR 80 | Temp 98.5°F | Resp 16 | Ht 61.0 in | Wt 202.9 lb

## 2023-10-05 DIAGNOSIS — C50412 Malignant neoplasm of upper-outer quadrant of left female breast: Secondary | ICD-10-CM

## 2023-10-09 NOTE — Progress Notes (Signed)
Radiation Oncology         [phone] ________________________________  Name: Kendra Collins        MRN: [id number]  Date of Service: 10/05/2023 DOB: 27-Dec-1965  PI:[id number], Lowell Guitar, MD       REFERRING PHYSICIAN: Maris Berger, MD   DIAGNOSIS: Invasive carcinoma of the left breast   HISTORY OF PRESENT ILLNESS: The patient completed adjuvant breast radiation approximately 2 weeks ago.  This is her first follow-up visit   PAST MEDICAL HISTORY:  Past Medical History:  Diagnosis Date   Anxiety    Asthma    Breast cancer (HCC)    Depression    GERD (gastroesophageal reflux disease)        PAST SURGICAL HISTORY: Past Surgical History:  Procedure Laterality Date   ABDOMINAL HYSTERECTOMY  2016   PARTIAL - PT STILL HAS OVARIES   BIOPSY BREAST Left    2018   BREAST BIOPSY Left    2024   BREAST LUMPECTOMY Left    2024   LYMPH NODE BIOPSY Left    AXILLARY 2024   TUBAL LIGATION  2006     FAMILY HISTORY:  Family History  Problem Relation Age of Onset   Colitis Mother    Osteoporosis Mother    CVA Father    Heart disease Father    Heart attack Father    Hyperlipidemia Father    Colitis Sister    Heart disease Brother    Osteoporosis Brother      SOCIAL HISTORY:  reports that she quit smoking about 16 years ago. Her smoking use included cigarettes. She has never used smokeless tobacco. She reports that she does not drink alcohol and does not use drugs.   ALLERGIES: Ibuprofen, Aspirin, Penicillin g, and Sulfamethoxazole-trimethoprim   MEDICATIONS:  Current Outpatient Medications  Medication Sig Dispense Refill   Dermatological Products, Misc. (STRATA XRT) GEL Apply topically 2 (two) times daily.     PATADAY 0.2 % SOLN Instill 1 drop every day by ophthalmic route as needed for 90 days.     acetaminophen (TYLENOL) 325 MG tablet Take 650 mg by mouth every 6 (six) hours as needed.     albuterol (VENTOLIN HFA) 108 (90 Base) MCG/ACT inhaler Inhale 2 puffs into  the lungs every 4 (four) hours as needed for shortness of breath or wheezing. (Patient not taking: Reported on 04/08/2020)     ANUSOL-HC 2.5 % rectal cream Apply 1 application twice a day by topical route as needed for 30 days.     cetirizine (ZYRTEC) 10 MG tablet 1 tablet at bedtime as needed  for allergies.     hydrocortisone 2.5 % cream Apply 1 application twice a day by topical route as needed for 30 days.     letrozole (FEMARA) 2.5 MG tablet Take 1 tablet (2.5 mg total) by mouth daily. 90 tablet 3   lidocaine-prilocaine (EMLA) cream Apply to affected area once 30 g 3   magic mouthwash (nystatin, diphenhydrAMINE, alum & mag hydroxide) suspension mixture Swish and spit 5 mLs 4 (four) times daily. 240 mL 0   omeprazole (PRILOSEC OTC) 20 MG tablet Take 20 mg by mouth daily as needed (acid reflux).     ondansetron (ZOFRAN) 8 MG tablet Take 1 tablet (8 mg total) by mouth every 8 (eight) hours as needed for nausea or vomiting. Start on the third day after chemotherapy. 30 tablet 1   oxyCODONE (OXY IR/ROXICODONE) 5 MG immediate release tablet Take 1 tablet by mouth every  4 (four) hours as needed.     pantoprazole (PROTONIX) 40 MG tablet Take 1 tablet every day by oral route for 90 days, for acid reflux.     prochlorperazine (COMPAZINE) 10 MG tablet Take 1 tablet (10 mg total) by mouth every 6 (six) hours as needed for nausea or vomiting. 30 tablet 1   No current facility-administered medications for this encounter.     REVIEW OF SYSTEMS: She reports that her energy level remains somewhat depressed.  She experiences occasional short-lived pains in her left breast.  She reports no fevers or chills.     PHYSICAL EXAM:  Wt Readings from Last 3 Encounters:  10/05/23 202 lb 14.4 oz (92 kg)  08/02/23 204 lb 9.6 oz (92.8 kg)  07/24/23 206 lb 11.2 oz (93.8 kg)   Temp Readings from Last 3 Encounters:  10/05/23 98.5 F (36.9 C) (Oral)  08/02/23 98.7 F (37.1 C) (Oral)  07/24/23 97.9 F (36.6 C)    BP Readings from Last 3 Encounters:  10/05/23 (!) 157/87  08/02/23 (!) 118/90  07/24/23 129/88   Pulse Readings from Last 3 Encounters:  10/05/23 80  08/02/23 81  07/24/23 87   Pain Assessment Pain Score: 4  Pain Loc: Breast/10  No cervical or supraclavicular lymphadenopathy is appreciated.  Palpation of her left breast reveals mild tenderness medially.  Her radiation related skin changes have largely resolved.   IMPRESSION/PLAN: 1.  She is doing well approximately 2 weeks out from completion of adjuvant left breast radiation.  Her radiation related skin changes continue to improve.  She continues to be followed by Dr. Lequita Halt, whom she saw 2 days ago, as well as by Dr. Melvyn Neth, whom she sees in March.  As she will be following up routinely with Dr. Melvyn Neth, further follow-up in our department will be on an as needed basis, though I encouraged her to contact me at anytime with any questions or concerns she may have.    Kendra Collins A. Thersa Salt, MD   **Disclaimer: This note was dictated with voice recognition software. Similar sounding words can inadvertently be transcribed and this note may contain transcription errors which may not have been corrected upon publication of note.**

## 2023-10-19 ENCOUNTER — Ambulatory Visit: Payer: No Typology Code available for payment source | Admitting: Radiation Oncology

## 2023-11-14 ENCOUNTER — Encounter: Payer: Self-pay | Admitting: Oncology

## 2023-11-20 NOTE — Progress Notes (Unsigned)
 [facility]  [address] [place],  [place]  [address] [phone]  Clinic Day:  11/20/2023  Referring physician: Maris Berger, MD   HISTORY OF PRESENT ILLNESS:  Kendra Collins is a 58 y.o. female withstage IA (T1c N0 M0) hormone positive breast cancer, status post a left breast lumpectomy in June 2024.   Due to having a high Oncotype score, the patient received 4 cycles of adjuvant Taxotere/Cytoxan, which were completed in October 2024.   She also completed her adjuvant breast radiation .  She has been taking letrozole since November 2024.    PHYSICAL EXAM:  There were no vitals taken for this visit. Wt Readings from Last 3 Encounters:  10/05/23 202 lb 14.4 oz (92 kg)  08/02/23 204 lb 9.6 oz (92.8 kg)  07/24/23 206 lb 11.2 oz (93.8 kg)   There is no height or weight on file to calculate BMI. Performance status (ECOG): 0 - Asymptomatic Physical Exam Constitutional:      Appearance: Normal appearance.  HENT:     Mouth/Throat:     Pharynx: Oropharynx is clear. No oropharyngeal exudate.  Cardiovascular:     Rate and Rhythm: Normal rate and regular rhythm.     Heart sounds: No murmur heard.    No friction rub. No gallop.  Pulmonary:     Breath sounds: Normal breath sounds.  Chest:  Breasts:    Right: No swelling, bleeding, inverted nipple, mass, nipple discharge or skin change.     Left: No swelling, bleeding, inverted nipple, mass, nipple discharge or skin change.     Comments: Her left breast has healed very well from her recent surgery. Abdominal:     General: Bowel sounds are normal. There is no distension.     Palpations: Abdomen is soft. There is no mass.     Tenderness: There is no abdominal tenderness.  Musculoskeletal:        General: No tenderness.     Cervical back: Normal range of motion and neck supple.     Right lower leg: No edema.     Left lower leg: No edema.  Lymphadenopathy:     Cervical: No cervical adenopathy.      Right cervical: No superficial, deep or posterior cervical adenopathy.    Left cervical: No superficial, deep or posterior cervical adenopathy.     Upper Body:     Right upper body: No supraclavicular or axillary adenopathy.     Left upper body: No supraclavicular or axillary adenopathy.     Lower Body: No right inguinal adenopathy. No left inguinal adenopathy.  Skin:    Coloration: Skin is not jaundiced.     Findings: No lesion or rash.  Neurological:     General: No focal deficit present.     Mental Status: She is alert and oriented to person, place, and time. Mental status is at baseline.  Psychiatric:        Mood and Affect: Mood normal.        Behavior: Behavior normal.        Thought Content: Thought content normal.        Judgment: Judgment normal.   LABS:  Latest Reference Range & Units 07/24/23 15:48  Sodium 135 - 145 mmol/L 140  Potassium 3.5 - 5.1 mmol/L 3.9  Chloride 98 - 111 mmol/L 106  CO2 22 - 32 mmol/L 26  Glucose 70 - 99 mg/dL 130 (H)  BUN 6 - 20 mg/dL 11  Creatinine  0.44 - 1.00 mg/dL 3.66  Calcium 8.9 - 44.0 mg/dL 8.9  Anion gap 5 - 15  8  Alkaline Phosphatase 38 - 126 U/L 94  Albumin 3.5 - 5.0 g/dL 3.7  AST 15 - 41 U/L 30  ALT 0 - 44 U/L 23  Total Protein 6.5 - 8.1 g/dL 6.2 (L)  Total Bilirubin <1.2 mg/dL 0.2  GFR, Est Non African American >60 mL/min >60  WBC 4.0 - 10.5 K/uL 7.0  RBC 3.87 - 5.11 MIL/uL 3.91  Hemoglobin 12.0 - 15.0 g/dL 34.7 (L)  HCT 42.5 - 95.6 % 33.5 (L)  MCV 80.0 - 100.0 fL 85.7  MCH 26.0 - 34.0 pg 28.9  MCHC 30.0 - 36.0 g/dL 38.7  RDW 56.4 - 33.2 % 17.4 (H)  Platelets 150 - 400 K/uL 324  nRBC 0.0 - 0.2 % 0 /100 WBC 0.00 0  Neutrophils % 51  Lymphocytes % 28  Monocytes Relative % 11  Eosinophil % 9  Basophil % 1  Immature Granulocytes % 0  NEUT# 1.7 - 7.7 K/uL 3.6  Lymphs Abs 0.7 - 4.0 K/uL 2.0  Monocyte # 0.1 - 1.0 K/uL 0.8  Eosinophils Absolute 0.0 - 0.5 K/uL 0.6 (H)  Basophils Absolute 0.0 - 0.1 K/uL 0.1  Abs  Immature Granulocytes 0.00 - 0.07 K/uL 0.02  (H): Data is abnormally high (L): Data is abnormally low  ASSESSMENT & PLAN:  Kendra Collins is a 58 y.o. 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  She completed her 4 cycles of Taxotere/Cytoxan in October 2024.  Based upon her clinical breast exam today, the patient remains disease free.  She will now enter the next phase of her adjuvant therapy, which will include both endocrine and radiation therapy.  I will place her on letrozole, which she will take daily x 5 years.  She has also been referred to radiation oncology so her breast radiation treatments can commence.  Moving forward, I will follow her with clinical breast exams every 4 months to ensure I do not feel anything that would suggest early disease recurrence.  I also reassured the patient her fatigue and lacrimation will continue to improve the further she gets out from her last cycle of adjuvant chemotherapy.  The patient understands all the plans discussed today and is in agreement with them.  Margit Batte Kirby Funk, MD

## 2023-11-21 ENCOUNTER — Inpatient Hospital Stay: Payer: No Typology Code available for payment source | Attending: Oncology | Admitting: Oncology

## 2023-11-21 ENCOUNTER — Other Ambulatory Visit: Payer: Self-pay | Admitting: Oncology

## 2023-11-21 ENCOUNTER — Inpatient Hospital Stay

## 2023-11-21 VITALS — BP 122/86 | HR 72 | Temp 98.0°F | Resp 16 | Ht 61.0 in | Wt 201.9 lb

## 2023-11-21 DIAGNOSIS — Z170 Estrogen receptor positive status [ER+]: Secondary | ICD-10-CM | POA: Diagnosis not present

## 2023-11-21 DIAGNOSIS — Z79811 Long term (current) use of aromatase inhibitors: Secondary | ICD-10-CM | POA: Insufficient documentation

## 2023-11-21 DIAGNOSIS — C50412 Malignant neoplasm of upper-outer quadrant of left female breast: Secondary | ICD-10-CM

## 2023-11-21 DIAGNOSIS — Z452 Encounter for adjustment and management of vascular access device: Secondary | ICD-10-CM | POA: Insufficient documentation

## 2023-11-21 DIAGNOSIS — C50912 Malignant neoplasm of unspecified site of left female breast: Secondary | ICD-10-CM | POA: Insufficient documentation

## 2023-11-21 DIAGNOSIS — Z923 Personal history of irradiation: Secondary | ICD-10-CM | POA: Insufficient documentation

## 2023-11-21 LAB — CBC WITH DIFFERENTIAL (CANCER CENTER ONLY)
Abs Immature Granulocytes: 0.02 10*3/uL (ref 0.00–0.07)
Basophils Absolute: 0 10*3/uL (ref 0.0–0.1)
Basophils Relative: 0 %
Eosinophils Absolute: 0.4 10*3/uL (ref 0.0–0.5)
Eosinophils Relative: 6 %
HCT: 37.9 % (ref 36.0–46.0)
Hemoglobin: 12.7 g/dL (ref 12.0–15.0)
Immature Granulocytes: 0 %
Lymphocytes Relative: 28 %
Lymphs Abs: 2 10*3/uL (ref 0.7–4.0)
MCH: 26.4 pg (ref 26.0–34.0)
MCHC: 33.5 g/dL (ref 30.0–36.0)
MCV: 78.8 fL — ABNORMAL LOW (ref 80.0–100.0)
Monocytes Absolute: 0.6 10*3/uL (ref 0.1–1.0)
Monocytes Relative: 8 %
Neutro Abs: 4.1 10*3/uL (ref 1.7–7.7)
Neutrophils Relative %: 58 %
Platelet Count: 239 10*3/uL (ref 150–400)
RBC: 4.81 MIL/uL (ref 3.87–5.11)
RDW: 13.7 % (ref 11.5–15.5)
WBC Count: 7.1 10*3/uL (ref 4.0–10.5)
nRBC: 0 % (ref 0.0–0.2)
nRBC: 0 /100{WBCs}

## 2023-11-21 NOTE — Progress Notes (Signed)
 [facility]  [address] [place],  [place]  [address] [phone]  Clinic Day:  11/21/2023  Referring physician: Maris Berger, MD   HISTORY OF PRESENT ILLNESS:  The patient is a 58 y.o. 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   Due to having a high Oncotype score, the patient received 4 cycles of adjuvant Taxotere/Cytoxan, which were completed in October 2024.   She also completed her adjuvant breast radiation .  She has been taking letrozole since November 2024.  She comes in today for routine follow-up.  Since her last visit, the patient has been doing fairly well.  However, she does have bouts of fatigue which potentially affect her when doing more exertional jobs at her place of work.  However, she denies noticing any particular changes in her breast which concerned her for early disease recurrence.      PHYSICAL EXAM:  Blood pressure 122/86, pulse 72, temperature 98 F (36.7 C), temperature source Oral, resp. rate 16, height 5\' 1"  (1.549 m), weight 201 lb 14.4 oz (91.6 kg), SpO2 98%. Wt Readings from Last 3 Encounters:  11/21/23 201 lb 14.4 oz (91.6 kg)  10/05/23 202 lb 14.4 oz (92 kg)  08/02/23 204 lb 9.6 oz (92.8 kg)   Body mass index is 38.15 kg/m. Performance status (ECOG): 1 - Symptomatic but completely ambulatory Physical Exam Constitutional:      Appearance: Normal appearance.  HENT:     Mouth/Throat:     Pharynx: Oropharynx is clear. No oropharyngeal exudate.  Cardiovascular:     Rate and Rhythm: Normal rate and regular rhythm.     Heart sounds: No murmur heard.    No friction rub. No gallop.  Pulmonary:     Breath sounds: Normal breath sounds.  Chest:  Breasts:    Right: No swelling, bleeding, inverted nipple, mass, nipple discharge or skin change.     Left: No swelling, bleeding, inverted nipple, mass, nipple discharge or skin change.  Abdominal:      General: Bowel sounds are normal. There is no distension.     Palpations: Abdomen is soft. There is no mass.     Tenderness: There is no abdominal tenderness.  Musculoskeletal:        General: No tenderness.     Cervical back: Normal range of motion and neck supple.     Right lower leg: No edema.     Left lower leg: No edema.  Lymphadenopathy:     Cervical: No cervical adenopathy.     Right cervical: No superficial, deep or posterior cervical adenopathy.    Left cervical: No superficial, deep or posterior cervical adenopathy.     Upper Body:     Right upper body: No supraclavicular or axillary adenopathy.     Left upper body: No supraclavicular or axillary adenopathy.     Lower Body: No right inguinal adenopathy. No left inguinal adenopathy.  Skin:    Coloration: Skin is not jaundiced.     Findings: No lesion or rash.  Neurological:     General: No focal deficit present.     Mental Status: She is alert and oriented to person, place, and time. Mental status is at baseline.  Psychiatric:        Mood and Affect: Mood normal.        Behavior: Behavior normal.        Thought Content: Thought content  normal.        Judgment: Judgment normal.     LABS:      Latest Ref Rng & Units 11/21/2023    3:21 PM 07/24/2023    3:48 PM 06/26/2023   12:00 AM  CBC  WBC 4.0 - 10.5 K/uL 7.1  7.0  7.9      Hemoglobin 12.0 - 15.0 g/dL 11.9  14.7  82.9      Hematocrit 36.0 - 46.0 % 37.9  33.5  34      Platelets 150 - 400 K/uL 239  324  255         This result is from an external source.      Latest Ref Rng & Units 07/24/2023    3:48 PM 06/26/2023    9:14 AM 06/05/2023    9:59 AM  CMP  Glucose 70 - 99 mg/dL 562  130  865   BUN 6 - 20 mg/dL 11  8  11    Creatinine 0.44 - 1.00 mg/dL 7.84  6.96  2.95   Sodium 135 - 145 mmol/L 140  141  140   Potassium 3.5 - 5.1 mmol/L 3.9  3.6  3.7   Chloride 98 - 111 mmol/L 106  107  106   CO2 22 - 32 mmol/L 26  25  24    Calcium 8.9 - 10.3 mg/dL 8.9  8.8   9.0   Total Protein 6.5 - 8.1 g/dL 6.2  6.8  6.9   Total Bilirubin <1.2 mg/dL 0.2  0.4  0.5   Alkaline Phos 38 - 126 U/L 94  84  101   AST 15 - 41 U/L 30  24  21    ALT 0 - 44 U/L 23  26  21      ASSESSMENT & PLAN:  Assessment/Plan:  A 58 y.o. 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  This was followed by 4 cycles of adjuvant Taxotere/Cytoxan chemotherapy, as well as breast radiation.  Based upon her clinical breast exam today, the patient remains disease-free.  She knows to continue taking her letrozole on a daily basis to complete 5 total years of endocrine therapy.  Otherwise, as she is doing well, I will see her back in 4 months for her next clinical breast exam.  I will ensure her annual mammogram is done before her next visit for her continued radiographic breast cancer surveillance.  The patient understands all the plans discussed today and is in agreement with them.    Daquann Merriott Kirby Funk, MD

## 2023-11-23 ENCOUNTER — Other Ambulatory Visit: Payer: Self-pay

## 2023-11-24 ENCOUNTER — Inpatient Hospital Stay

## 2023-11-24 VITALS — BP 120/80 | HR 70 | Temp 98.0°F | Resp 18 | Ht 61.0 in

## 2023-11-24 DIAGNOSIS — Z452 Encounter for adjustment and management of vascular access device: Secondary | ICD-10-CM | POA: Diagnosis not present

## 2023-11-24 MED ORDER — HEPARIN SOD (PORK) LOCK FLUSH 100 UNIT/ML IV SOLN
500.0000 [IU] | Freq: Once | INTRAVENOUS | Status: AC
  Administered 2023-11-24: 500 [IU] via INTRAVENOUS

## 2023-11-24 MED ORDER — SODIUM CHLORIDE 0.9% FLUSH
10.0000 mL | INTRAVENOUS | Status: DC | PRN
  Administered 2023-11-24: 10 mL via INTRAVENOUS

## 2023-11-24 NOTE — Patient Instructions (Signed)
 Central Line in Adults: What to Expect A central line is a soft tube that is put into a vein so that it goes to a large vein above your heart. It can be used to: Give you medicine or fluids. Give you nutrients. Take blood or give you blood for testing or treatments. Give chemotherapy or dialysis. Provide IV access if veins in your hands or arms are difficult to use. Types of central lines There are four main types of central lines: Peripherally inserted central catheter (PICC) line. This type is usually put in the upper arm and goes up the arm to the vein above your heart. This is used for one week or longer. Tunneled central line. This type is placed in a large vein in the neck, chest, or groin. It is tunneled under the skin and brought out through a small cut in your skin. Non-tunneled central line. This type is used for a shorter time than other types, usually for 7 days at the most. It is put in the neck, chest, or groin. Implanted port. This type can stay in place longer than other types of central lines. It is normally put in the upper chest but can also be placed in the upper arm or the belly. Surgery is needed to put it in and take it out. The type of central line you get will depend on how long you need it and your medical condition. Tell a doctor about: Any allergies you have. All medicines you take. These include vitamins, herbs, eye drops, and creams. Any problems you or family members have had with anesthesia. Any bleeding problems you have. Any surgeries you have had. Any medical conditions you have. Whether you're pregnant or may be pregnant. What are the risks? Your doctor will talk with you about risks. These may include: Infection. A blood clot. Bleeding. Getting a hole or crack in the central line. If this happens, the central line will need to be replaced. The catheter moving or coming out of place. A collapsed lung. Damage to other structures or  organs. What happens before the procedure? Medicines Ask about changing or stopping: Any medicines you take. Any vitamins, herbs, or supplements you take. Do not take aspirin or ibuprofen unless you're told to. Surgery safety For your safety, you may: Need to wash your skin with a soap that kills germs. Get antibiotics. Have your procedure site marked. Have hair removed at the procedure site. General instructions Eat and drink as told. Plan to have a responsible adult take you home from the hospital or clinic. Ask if you'll be staying overnight in the hospital. If you'll be going home right after the procedure, plan to have a responsible adult: Drive you home from the hospital or clinic. You won't be allowed to drive. Stay with you for the time you're told. What happens during the procedure? An IV will be put into one of your veins. You may be given: A sedative to help you relax. Anesthesia to keep you from feeling pain. Your skin will be cleaned with a soap that kills germs, and you may be covered with clean sheet called a drape. Your blood pressure, heart rate, breathing rate, and blood oxygen level will be checked during the procedure. The central line will be put into the vein and moved through it to the correct spot. The doctor may use X-ray equipment to help guide the central line to the right place. A bandage will be placed over the insertion area.  These steps may vary. Ask what you can expect. What can I expect after the procedure? You will watched closely until you leave. This includes checking your pain level, blood pressure, heart rate, and breathing rate. Caps may be put on the ends of the central line tubes. If you were given a sedative, do not drive or use machines until you're told it's safe. A sedative can make you sleepy. Follow these instructions at home: Caring for the tube  Follow instructions from your doctor about: Flushing the tube. Cleaning the tube and  the area around it. Only use germ-free, also called sterile, supplies to flush the central line. The supplies should be from your doctor, a pharmacy, or another place that your doctor recommends. Before you flush the tube or clean the area around it: Wash your hands with soap and water for at least 20 seconds. If you can't use soap and water, use hand sanitizer. Clean the central line hub with rubbing alcohol. To do this: Clean the central line hub with a new alcohol wipe. Scrub it using a twisting motion and rub for 10 to 15 seconds or for 30 twists. Follow the manufacturer's instructions. Be sure you scrub the top of the hub, not just the sides. Let the hub dry before use. Keep it from touching anything while drying. Do not re-use alcohol pads. Caring for your skin Check the skin around the central line every day for signs of infection. Check for: Redness, swelling, or pain. Fluid or blood. Warmth. Pus or a bad smell. Keep the area where the tube was put in clean and dry. Change bandages only as told by your doctor. Keep your bandage dry. If a bandage gets wet, have it changed right away. Storing and throwing away supplies Keep your supplies in a clean, dry location. Throw away any used syringes in a container that is only for sharp items, called a sharps container. You can buy a sharps container from a pharmacy, or you can make one by using an empty hard plastic bottle with a cover. Place any used bandages or infusion bags into a plastic bag. Throw that bag in the trash. General instructions Follow instructions from your doctor for the type of device that you have. Keep the tube clamped unless it's being used. If you or someone else accidentally pulls on the tube, make sure: The bandage is OK. There is no bleeding. The tube has not been pulled out. Do not use scissors or sharp objects near the tube. Do not take baths, swim, or use a hot tub until you're told it's OK. Ask if you can  shower. Ask your doctor what activities are safe for you. Your doctor may tell you not to lift anything or move your arm too much on the side of your central line. Contact a doctor if: You have signs of an infection where the tube was put in. The skin is irritated near the bandage. You catheter appears to be getting longer. This may mean it is coming out of the vein. Get help right away if: You have: A fever or chills. Shortness of breath. Pain in your chest. A fast heartbeat. Swelling in your neck, face, chest, or arm. You feel dizzy or you faint. There are red lines coming from where the tube was put in. The area where the tube was put in is bleeding and the bleeding won't stop. Your tube is hard to flush. You do not get a blood return from the  tube. The tube gets loose or comes out. The tube has a hole or a tear. The tube leaks. This information is not intended to replace advice given to you by your health care provider. Make sure you discuss any questions you have with your health care provider. Document Revised: 04/03/2023 Document Reviewed: 04/03/2023 Elsevier Patient Education  2024 [employer].

## 2023-12-07 ENCOUNTER — Other Ambulatory Visit: Payer: Self-pay

## 2024-03-25 NOTE — Progress Notes (Unsigned)
 [facility]  [address] [place],  [place]  [address] [phone]  Clinic Day:  03/26/2024  Referring physician: Marelyn Quill, MD   HISTORY OF PRESENT ILLNESS:  The patient is a 58 y.o. 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   Due to having a high Oncotype score, the patient received 4 cycles of adjuvant Taxotere /Cytoxan , which were completed in October 2024.   She also completed her adjuvant breast radiation .  She has been taking letrozole  since November 2024.  She comes in today for routine follow-up.  Since her last visit, the patient has been doing well.  She denies noticing any particular changes in her breasts which concern her for early disease recurrence.   Of note, her annual mammogram in April 2025 showed no evidence of disease recurrence.  PHYSICAL EXAM:  Blood pressure 125/81, pulse 89, temperature 98.1 F (36.7 C), temperature source Oral, resp. rate 20, height 5' 1 (1.549 m), weight 206 lb 1.6 oz (93.5 kg), SpO2 99%. Wt Readings from Last 3 Encounters:  03/26/24 206 lb 1.6 oz (93.5 kg)  03/26/24 206 lb 1.3 oz (93.5 kg)  11/21/23 201 lb 14.4 oz (91.6 kg)   Body mass index is 38.94 kg/m. Performance status (ECOG): 1 - Symptomatic but completely ambulatory Physical Exam Constitutional:      Appearance: Normal appearance.  HENT:     Mouth/Throat:     Pharynx: Oropharynx is clear. No oropharyngeal exudate.  Cardiovascular:     Rate and Rhythm: Normal rate and regular rhythm.     Heart sounds: No murmur heard.    No friction rub. No gallop.  Pulmonary:     Breath sounds: Normal breath sounds.  Chest:  Breasts:    Right: No swelling, bleeding, inverted nipple, mass, nipple discharge or skin change.     Left: No swelling, bleeding, inverted nipple, mass, nipple discharge or skin change.  Abdominal:     General: Bowel sounds are normal. There is no  distension.     Palpations: Abdomen is soft. There is no mass.     Tenderness: There is no abdominal tenderness.  Musculoskeletal:        General: No tenderness.     Cervical back: Normal range of motion and neck supple.     Right lower leg: No edema.     Left lower leg: No edema.  Lymphadenopathy:     Cervical: No cervical adenopathy.     Right cervical: No superficial, deep or posterior cervical adenopathy.    Left cervical: No superficial, deep or posterior cervical adenopathy.     Upper Body:     Right upper body: No supraclavicular or axillary adenopathy.     Left upper body: No supraclavicular or axillary adenopathy.     Lower Body: No right inguinal adenopathy. No left inguinal adenopathy.  Skin:    Coloration: Skin is not jaundiced.     Findings: No lesion or rash.  Neurological:     General: No focal deficit present.     Mental Status: She is alert and oriented to person, place, and time. Mental status is at baseline.  Psychiatric:        Mood and Affect: Mood normal.        Behavior: Behavior normal.        Thought Content: Thought content normal.        Judgment: Judgment normal.  ASSESSMENT & PLAN:  Assessment/Plan:  A 58 y.o. 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  This was followed by 4 cycles of adjuvant Taxotere /Cytoxan  chemotherapy, as well as breast radiation.  Based upon her clinical breast exam today and recent annual mammogram, the patient remains disease-free.  She knows to continue taking her letrozole  on a daily basis to complete 5 total years of endocrine therapy.  Otherwise, I will see her back in 4 months for her next clinical breast exam.  The patient understands all the plans discussed today and is in agreement with them.    Sumner Boesch DELENA Kerns, MD

## 2024-03-26 ENCOUNTER — Telehealth: Payer: Self-pay | Admitting: Oncology

## 2024-03-26 ENCOUNTER — Inpatient Hospital Stay

## 2024-03-26 ENCOUNTER — Inpatient Hospital Stay: Attending: Oncology | Admitting: Oncology

## 2024-03-26 VITALS — BP 125/81 | HR 89 | Temp 98.1°F | Resp 20 | Wt 206.1 lb

## 2024-03-26 VITALS — BP 125/81 | HR 89 | Temp 98.1°F | Resp 20 | Ht 61.0 in | Wt 206.1 lb

## 2024-03-26 DIAGNOSIS — Z452 Encounter for adjustment and management of vascular access device: Secondary | ICD-10-CM | POA: Diagnosis present

## 2024-03-26 DIAGNOSIS — Z79811 Long term (current) use of aromatase inhibitors: Secondary | ICD-10-CM | POA: Diagnosis not present

## 2024-03-26 DIAGNOSIS — Z170 Estrogen receptor positive status [ER+]: Secondary | ICD-10-CM | POA: Insufficient documentation

## 2024-03-26 DIAGNOSIS — Z923 Personal history of irradiation: Secondary | ICD-10-CM | POA: Diagnosis not present

## 2024-03-26 DIAGNOSIS — C50411 Malignant neoplasm of upper-outer quadrant of right female breast: Secondary | ICD-10-CM | POA: Diagnosis not present

## 2024-03-26 DIAGNOSIS — C50919 Malignant neoplasm of unspecified site of unspecified female breast: Secondary | ICD-10-CM | POA: Insufficient documentation

## 2024-03-26 MED ORDER — SODIUM CHLORIDE 0.9% FLUSH
10.0000 mL | INTRAVENOUS | Status: DC | PRN
  Administered 2024-03-26: 10 mL via INTRAVENOUS

## 2024-03-26 MED ORDER — HEPARIN SOD (PORK) LOCK FLUSH 100 UNIT/ML IV SOLN
500.0000 [IU] | Freq: Once | INTRAVENOUS | Status: AC
  Administered 2024-03-26: 500 [IU] via INTRAVENOUS

## 2024-03-26 NOTE — Telephone Encounter (Signed)
 Patient has been scheduled for follow-up visit per 03/26/24 LOS.  Pt given an appt calendar with date and time.

## 2024-03-27 ENCOUNTER — Other Ambulatory Visit: Payer: Self-pay

## 2024-07-13 ENCOUNTER — Other Ambulatory Visit: Payer: Self-pay | Admitting: Oncology

## 2024-07-24 ENCOUNTER — Encounter: Payer: Self-pay | Admitting: Oncology

## 2024-07-29 NOTE — Progress Notes (Unsigned)
 [facility]  [address] [place],  [place]  [address] [phone]  Clinic Day:  07/29/2024  Referring physician: Marelyn Quill, MD   HISTORY OF PRESENT ILLNESS:  The patient is a 58 y.o. 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   Due to having a high Oncotype score, the patient received 4 cycles of adjuvant Taxotere /Cytoxan , which were completed in October 2024.   She also completed her adjuvant breast radiation .  She has been taking letrozole  since November 2024.  She comes in today for routine follow-up.  Since her last visit, the patient has been doing well.  She denies noticing any particular changes in her breasts which concern her for early disease recurrence.   Of note, her annual mammogram in April 2025 showed no evidence of disease recurrence.  PHYSICAL EXAM:  There were no vitals taken for this visit. Wt Readings from Last 3 Encounters:  03/26/24 206 lb 1.6 oz (93.5 kg)  03/26/24 206 lb 1.3 oz (93.5 kg)  11/21/23 201 lb 14.4 oz (91.6 kg)   There is no height or weight on file to calculate BMI. Performance status (ECOG): 1 - Symptomatic but completely ambulatory Physical Exam Constitutional:      Appearance: Normal appearance.  HENT:     Mouth/Throat:     Pharynx: Oropharynx is clear. No oropharyngeal exudate.  Cardiovascular:     Rate and Rhythm: Normal rate and regular rhythm.     Heart sounds: No murmur heard.    No friction rub. No gallop.  Pulmonary:     Breath sounds: Normal breath sounds.  Chest:  Breasts:    Right: No swelling, bleeding, inverted nipple, mass, nipple discharge or skin change.     Left: No swelling, bleeding, inverted nipple, mass, nipple discharge or skin change.  Abdominal:     General: Bowel sounds are normal. There is no distension.     Palpations: Abdomen is soft. There is no mass.     Tenderness: There is no abdominal  tenderness.  Musculoskeletal:        General: No tenderness.     Cervical back: Normal range of motion and neck supple.     Right lower leg: No edema.     Left lower leg: No edema.  Lymphadenopathy:     Cervical: No cervical adenopathy.     Right cervical: No superficial, deep or posterior cervical adenopathy.    Left cervical: No superficial, deep or posterior cervical adenopathy.     Upper Body:     Right upper body: No supraclavicular or axillary adenopathy.     Left upper body: No supraclavicular or axillary adenopathy.     Lower Body: No right inguinal adenopathy. No left inguinal adenopathy.  Skin:    Coloration: Skin is not jaundiced.     Findings: No lesion or rash.  Neurological:     General: No focal deficit present.     Mental Status: She is alert and oriented to person, place, and time. Mental status is at baseline.  Psychiatric:        Mood and Affect: Mood normal.        Behavior: Behavior normal.        Thought Content: Thought content normal.        Judgment: Judgment normal.    ASSESSMENT & PLAN:  Assessment/Plan:  A 58 y.o. female with stage IA (T1c N0  M0) hormone positive breast cancer, status post a left breast lumpectomy in June 2024.  This was followed by 4 cycles of adjuvant Taxotere /Cytoxan  chemotherapy, as well as breast radiation.  Based upon her clinical breast exam today and recent annual mammogram, the patient remains disease-free.  She knows to continue taking her letrozole  on a daily basis to complete 5 total years of endocrine therapy.  Otherwise, I will see her back in 4 months for her next clinical breast exam.  The patient understands all the plans discussed today and is in agreement with them.    Marquese Burkland DELENA Kerns, MD

## 2024-07-30 ENCOUNTER — Inpatient Hospital Stay: Attending: Oncology | Admitting: Oncology

## 2024-07-30 ENCOUNTER — Other Ambulatory Visit: Payer: Self-pay

## 2024-07-30 ENCOUNTER — Inpatient Hospital Stay

## 2024-07-30 VITALS — BP 111/68 | HR 78 | Temp 98.3°F | Resp 16 | Ht 61.0 in | Wt 206.1 lb

## 2024-07-30 DIAGNOSIS — C50412 Malignant neoplasm of upper-outer quadrant of left female breast: Secondary | ICD-10-CM | POA: Diagnosis not present

## 2024-07-30 DIAGNOSIS — Z79811 Long term (current) use of aromatase inhibitors: Secondary | ICD-10-CM | POA: Diagnosis not present

## 2024-07-30 DIAGNOSIS — Z170 Estrogen receptor positive status [ER+]: Secondary | ICD-10-CM | POA: Insufficient documentation

## 2024-07-30 DIAGNOSIS — C50919 Malignant neoplasm of unspecified site of unspecified female breast: Secondary | ICD-10-CM | POA: Insufficient documentation

## 2024-07-31 ENCOUNTER — Telehealth: Payer: Self-pay | Admitting: Oncology

## 2024-07-31 ENCOUNTER — Other Ambulatory Visit: Payer: Self-pay

## 2024-07-31 NOTE — Telephone Encounter (Signed)
 Patient has been scheduled for follow-up visit per 07/30/24 LOS.  Pt aware of scheduled appt details.

## 2024-08-01 ENCOUNTER — Other Ambulatory Visit: Payer: Self-pay

## 2024-08-02 ENCOUNTER — Other Ambulatory Visit: Payer: Self-pay

## 2024-08-23 ENCOUNTER — Other Ambulatory Visit: Payer: Self-pay

## 2024-08-23 MED ORDER — LETROZOLE 2.5 MG PO TABS: 2.5000 mg | ORAL_TABLET | Freq: Every day | ORAL | 3 refills | Status: AC

## 2024-08-23 NOTE — Telephone Encounter (Signed)
Refill letrozole. 

## 2024-11-28 ENCOUNTER — Inpatient Hospital Stay: Admitting: Oncology

## 2024-11-28 ENCOUNTER — Inpatient Hospital Stay
